# Patient Record
Sex: Female | Born: 1990 | Race: Black or African American | Hispanic: No | Marital: Single | Smoking: Current every day smoker
Health system: Southern US, Community
[De-identification: ages and names within clinical notes are randomized; demographics above are authoritative.]

## PROBLEM LIST (undated history)

## (undated) DIAGNOSIS — D649 Anemia, unspecified: Secondary | ICD-10-CM

---

## 2017-09-11 ENCOUNTER — Emergency Department
Admission: EM | Admit: 2017-09-11 | Discharge: 2017-09-12 | Disposition: A | Discharge status: Other Acute Inpatient Hospital | Payer: Medicare Other | Attending: Emergency Medicine | Admitting: Emergency Medicine

## 2017-09-11 DIAGNOSIS — R45851 Suicidal ideations: Secondary | ICD-10-CM | POA: Insufficient documentation

## 2017-09-11 DIAGNOSIS — Z91199 Patient's noncompliance with other medical treatment and regimen due to unspecified reason: Secondary | ICD-10-CM

## 2017-09-11 DIAGNOSIS — F1420 Cocaine dependence, uncomplicated: Secondary | ICD-10-CM

## 2017-09-11 DIAGNOSIS — F329 Major depressive disorder, single episode, unspecified: Secondary | ICD-10-CM

## 2017-09-11 DIAGNOSIS — Z9119 Patient's noncompliance with other medical treatment and regimen: Secondary | ICD-10-CM

## 2017-09-11 DIAGNOSIS — F32A Depression, unspecified: Secondary | ICD-10-CM

## 2017-09-11 DIAGNOSIS — F209 Schizophrenia, unspecified: Secondary | ICD-10-CM | POA: Insufficient documentation

## 2017-09-11 DIAGNOSIS — F29 Unspecified psychosis not due to a substance or known physiological condition: Secondary | ICD-10-CM

## 2017-09-11 HISTORY — DX: Anemia, unspecified: D64.9

## 2017-09-11 LAB — CBC
HCT: 40.2 % (ref 35.0–47.0)
Hemoglobin: 13 g/dL (ref 12.0–16.0)
MCH: 26.3 pg (ref 26.0–34.0)
MCHC: 32.3 g/dL (ref 32.0–36.0)
MCV: 81.4 fL (ref 80.0–100.0)
PLATELETS: 444 10*3/uL — AB (ref 150–440)
RBC: 4.94 MIL/uL (ref 3.80–5.20)
RDW: 15.2 % — ABNORMAL HIGH (ref 11.5–14.5)
WBC: 8.6 10*3/uL (ref 3.6–11.0)

## 2017-09-11 LAB — COMPREHENSIVE METABOLIC PANEL
ALK PHOS: 76 U/L (ref 38–126)
ALT: 25 U/L (ref 14–54)
AST: 21 U/L (ref 15–41)
Albumin: 4 g/dL (ref 3.5–5.0)
Anion gap: 11 (ref 5–15)
BILIRUBIN TOTAL: 0.5 mg/dL (ref 0.3–1.2)
BUN: 6 mg/dL (ref 6–20)
CALCIUM: 9.3 mg/dL (ref 8.9–10.3)
CO2: 21 mmol/L — ABNORMAL LOW (ref 22–32)
CREATININE: 0.98 mg/dL (ref 0.44–1.00)
Chloride: 108 mmol/L (ref 101–111)
GFR calc non Af Amer: >60 mL/min (ref >60)
Glucose, Bld: 95 mg/dL (ref 65–99)
Potassium: 3.6 mmol/L (ref 3.5–5.1)
Sodium: 140 mmol/L (ref 135–145)
Total Protein: 7.4 g/dL (ref 6.5–8.1)

## 2017-09-11 LAB — URINE DRUG SCREEN, QUALITATIVE (ARMC ONLY)
Amphetamines, Ur Screen: NOT DETECTED
BARBITURATES, UR SCREEN: NOT DETECTED
Benzodiazepine, Ur Scrn: NOT DETECTED
CANNABINOID 50 NG, UR ~~LOC~~: NOT DETECTED
COCAINE METABOLITE, UR ~~LOC~~: POSITIVE — AB
MDMA (ECSTASY) UR SCREEN: NOT DETECTED
Methadone Scn, Ur: NOT DETECTED
Opiate, Ur Screen: NOT DETECTED
PHENCYCLIDINE (PCP) UR S: NOT DETECTED
TRICYCLIC, UR SCREEN: NOT DETECTED

## 2017-09-11 LAB — SALICYLATE LEVEL

## 2017-09-11 LAB — ETHANOL: Alcohol, Ethyl (B): <10 mg/dL (ref <10)

## 2017-09-11 LAB — ACETAMINOPHEN LEVEL: Acetaminophen (Tylenol), Serum: <10 ug/mL — ABNORMAL LOW (ref 10–30)

## 2017-09-11 LAB — POCT PREGNANCY, URINE: PREG TEST UR: NEGATIVE

## 2017-09-11 MED ORDER — LURASIDONE HCL 40 MG PO TABS
20.0000 mg | ORAL_TABLET | Freq: Every day | ORAL | Status: DC
  Administered 2017-09-12: 20 mg via ORAL
  Filled 2017-09-11: qty 1

## 2017-09-11 MED ORDER — OLANZAPINE 10 MG PO TABS
20.0000 mg | ORAL_TABLET | Freq: Every day | ORAL | Status: DC
  Administered 2017-09-11: 20 mg via ORAL
  Filled 2017-09-11: qty 2

## 2017-09-11 MED ORDER — LITHIUM CARBONATE ER 450 MG PO TBCR
750.0000 mg | EXTENDED_RELEASE_TABLET | Freq: Every day | ORAL | Status: DC
  Administered 2017-09-11: 750 mg via ORAL
  Filled 2017-09-11 (×2): qty 1

## 2017-09-11 NOTE — ED Notes (Signed)
Pt aware urine sample is needed, pt states she does not need to urinate at this time.

## 2017-09-11 NOTE — ED Notes (Signed)
Pt eating dinner at this time

## 2017-09-11 NOTE — ED Notes (Signed)
Pt's father notified pt had been moved rooms per pt request. Father made aware.
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
## 2017-09-11 NOTE — ED Notes (Signed)
[facility] Administrator: Justine Null - [phone]

## 2017-09-11 NOTE — ED Notes (Signed)
EKG given to EDP.  

## 2017-09-11 NOTE — ED Notes (Signed)
Report was received from Franki Cabot., RN; Pt. Verbalizes  complaints of Depression; Schizophrenia; having auditory/hallucinations; running away from the group home; distress of wanting to go to [place]; to live with her Dad(guardian); denies S.I./Hi. Continue to monitor with 15 min. Monitoring.

## 2017-09-11 NOTE — BHH Counselor (Signed)
Dr. Toni Amend accepted patient to [facility], however they are not accepting patients at this time; Patient can go into Unit on 09/12/17 AM

## 2017-09-11 NOTE — ED Provider Notes (Signed)
[facility] Emergency Department Provider Note  Time seen: 1:07 PM  I have reviewed the triage vital signs and the nursing notes.   HISTORY  Chief Complaint Suicidal    HPI Laura Briggs is a 26 y.o. female With a past medical history of anemia presents to the emergency department for possible suicidal ideation. According to report police officer found the patient after she ran away from a group home. At one point the patient stated she does not want to be here anymore, which was interpreted as a suicidal threat. Here the patient denies any SI or HI. She states she did not want to be at her group home anymore which is why she ran away. Denies any drug use or alcohol use. Denies any medical complaints.patient does appear somewhat depressed with a flat affect  Past Medical History:  Diagnosis Date  . Anemia     There are no active problems to display for this patient.   No past surgical history on file.  Prior to Admission medications   Not on File    No Known Allergies  No family history on file.  Social History Social History  Substance Use Topics  . Smoking status: Not on file  . Smokeless tobacco: Not on file  . Alcohol use Not on file    Review of Systems Constitutional: Negative for fever. Cardiovascular: Negative for chest pain. Respiratory: Negative for shortness of breath. Gastrointestinal: Negative for abdominal pain Musculoskeletal: Negative for back pain. Neurological: Negative for headache All other ROS negative  ____________________________________________   PHYSICAL EXAM:  VITAL SIGNS: ED Triage Vitals  Enc Vitals Group     BP 09/11/17 1126 130/79     Pulse Rate 09/11/17 1126 94     Resp 09/11/17 1126 16     Temp 09/11/17 1126 98.4 F (36.9 C)     Temp Source 09/11/17 1126 Oral     SpO2 09/11/17 1126 99 %     Weight 09/11/17 1127 210 lb (95.3 kg)     Height 09/11/17 1127  (1.626 m)     Head Circumference  --      Peak Flow --      Pain Score --      Pain Loc --      Pain Edu? --      Excl. in GC? --     Constitutional: Alert and oriented. Well appearing and in no distress. Eyes: Normal exam ENT   Head: Normocephalic and atraumatic   Mouth/Throat: Mucous membranes are moist. Cardiovascular: Normal rate, regular rhythm. No murmur Respiratory: Normal respiratory effort without tachypnea nor retractions. Breath sounds are clear Gastrointestinal: Soft and nontender. No distention.  Musculoskeletal: Nontender with normal range of motion in all extremities.  Neurologic:  Normal speech and language. No gross focal neurologic deficits Skin:  Skin is warm, dry and intact.  Psychiatric: flat affect, appears depressed.  ____________________________________________    INITIAL IMPRESSION / ASSESSMENT AND PLAN / ED COURSE  Pertinent labs & imaging results that were available during my care of the patient were reviewed by me and considered in my medical decision making (see chart for details).  patient presents to the emergency department after running away from a group home. Patient very clearly to me denies any SI or HI. However given the patient's statement to the officer which she interpreted as suicidal we will have psychiatry evaluate. Patient denies any drug or alcohol use. Patient's labs are largely within normal limits. Urinalysis  pending. Patient does not meet IVC criteria currently. differential at this time would include depression, suicidal ideation, underlying psychiatric condition.  specialist on call psychiatry evaluation pending. Patient care signed out to oncoming physician.  ____________________________________________   FINAL CLINICAL IMPRESSION(S) / ED DIAGNOSES  depression    Minna Antis, MD 09/11/17 1454

## 2017-09-11 NOTE — ED Notes (Signed)
Pt belonging placed in bag. Shirt, bra, pants, purse. No cell phone or money.

## 2017-09-11 NOTE — ED Provider Notes (Signed)
-----------------------------------------   5:17 PM on 09/11/2017 -----------------------------------------  ED ECG REPORT I, Dionne Bucy, the attending physician, personally viewed and interpreted this ECG.  Date: 09/11/2017 EKG Time: 1711 Rate: 64 Rhythm: sinus rhythm sinus arrhythmia QRS Axis: normal Intervals: normal ST/T Wave abnormalities: normal Narrative Interpretation: no evidence of acute ischemia    Dionne Bucy, MD 09/11/17 1718

## 2017-09-11 NOTE — ED Notes (Signed)
Father at beside.

## 2017-09-11 NOTE — ED Notes (Signed)
Copy of pt's Father's Letters of Appointment Guardian of the Person copy placed on pt's chart.

## 2017-09-11 NOTE — ED Notes (Signed)
Patient IVC and is pending admission.

## 2017-09-11 NOTE — ED Notes (Signed)
Patient states she is here because she got in argument with a staff member at her group home and left there to go to a friends house. She states she does not want to return there. Patient denies SI/HI. Patient states she has auditory hallucinations telling her  "she is no good." Patient denies that voices tell her to harm herself. Patient denies visual hallucinations. Patient with Q 15 minute checks in progress. Monitoring continues and patient remains safe on unit.

## 2017-09-11 NOTE — ED Notes (Signed)
SOC report given. SOC at bedside.

## 2017-09-11 NOTE — ED Triage Notes (Signed)
Pt came to ED, brought in by officer. From [facility]. Pt ran away 2 days ago, [place] and stated "I do not want to be here anymore." When asked by this Rn if pt had any thought of hurting herself pt responded by saying "I just had a mental breakdown." Denies psych history, denies on medication but police reports group home says she takes medication.

## 2017-09-12 ENCOUNTER — Inpatient Hospital Stay
Admission: AD | Admit: 2017-09-12 | Discharge: 2017-09-24 | DRG: 885 | Disposition: A | Discharge status: Home / Self Care | Payer: Medicare Other | Attending: Psychiatry | Admitting: Psychiatry

## 2017-09-12 DIAGNOSIS — Z91199 Patient's noncompliance with other medical treatment and regimen due to unspecified reason: Secondary | ICD-10-CM

## 2017-09-12 DIAGNOSIS — F200 Paranoid schizophrenia: Principal | ICD-10-CM | POA: Diagnosis present

## 2017-09-12 DIAGNOSIS — Z23 Encounter for immunization: Secondary | ICD-10-CM

## 2017-09-12 DIAGNOSIS — F1420 Cocaine dependence, uncomplicated: Secondary | ICD-10-CM | POA: Diagnosis present

## 2017-09-12 DIAGNOSIS — F17200 Nicotine dependence, unspecified, uncomplicated: Secondary | ICD-10-CM | POA: Diagnosis present

## 2017-09-12 DIAGNOSIS — X58XXXA Exposure to other specified factors, initial encounter: Secondary | ICD-10-CM | POA: Diagnosis present

## 2017-09-12 DIAGNOSIS — A150 Tuberculosis of lung: Secondary | ICD-10-CM

## 2017-09-12 DIAGNOSIS — F17210 Nicotine dependence, cigarettes, uncomplicated: Secondary | ICD-10-CM | POA: Diagnosis present

## 2017-09-12 DIAGNOSIS — Z91128 Patient's intentional underdosing of medication regimen for other reason: Secondary | ICD-10-CM

## 2017-09-12 DIAGNOSIS — T43596A Underdosing of other antipsychotics and neuroleptics, initial encounter: Secondary | ICD-10-CM | POA: Diagnosis present

## 2017-09-12 DIAGNOSIS — Z9119 Patient's noncompliance with other medical treatment and regimen: Secondary | ICD-10-CM

## 2017-09-12 DIAGNOSIS — F410 Panic disorder [episodic paroxysmal anxiety] without agoraphobia: Secondary | ICD-10-CM | POA: Diagnosis present

## 2017-09-12 DIAGNOSIS — F209 Schizophrenia, unspecified: Secondary | ICD-10-CM

## 2017-09-12 DIAGNOSIS — G4700 Insomnia, unspecified: Secondary | ICD-10-CM | POA: Diagnosis present

## 2017-09-12 MED ORDER — DIPHENHYDRAMINE HCL 25 MG PO CAPS: 50.0000 mg | ORAL_CAPSULE | Freq: Four times a day (QID) | ORAL | Status: DC | PRN

## 2017-09-12 MED ORDER — BENZTROPINE MESYLATE 1 MG PO TABS
0.5000 mg | ORAL_TABLET | Freq: Two times a day (BID) | ORAL | Status: DC
  Administered 2017-09-12: 0.5 mg via ORAL
  Filled 2017-09-12: qty 1

## 2017-09-12 MED ORDER — ACETAMINOPHEN 325 MG PO TABS: 650.0000 mg | ORAL_TABLET | Freq: Four times a day (QID) | ORAL | Status: DC | PRN

## 2017-09-12 MED ORDER — OLANZAPINE 10 MG PO TABS
20.0000 mg | ORAL_TABLET | Freq: Every day | ORAL | Status: DC
  Administered 2017-09-12: 20 mg via ORAL
  Filled 2017-09-12: qty 2

## 2017-09-12 MED ORDER — LITHIUM CARBONATE ER 300 MG PO TBCR
750.0000 mg | EXTENDED_RELEASE_TABLET | Freq: Every day | ORAL | Status: AC
  Administered 2017-09-12: 22:00:00 750 mg via ORAL
  Filled 2017-09-12 (×2): qty 1

## 2017-09-12 MED ORDER — LITHIUM CARBONATE ER 450 MG PO TBCR: 750.0000 mg | EXTENDED_RELEASE_TABLET | Freq: Every day | ORAL | Status: DC

## 2017-09-12 MED ORDER — TEMAZEPAM 15 MG PO CAPS
15.0000 mg | ORAL_CAPSULE | Freq: Every day | ORAL | Status: DC
  Administered 2017-09-12 – 2017-09-16 (×5): 15 mg via ORAL
  Filled 2017-09-12 (×5): qty 1

## 2017-09-12 MED ORDER — LURASIDONE HCL 20 MG PO TABS: 20.0000 mg | ORAL_TABLET | Freq: Every day | ORAL | Status: DC

## 2017-09-12 MED ORDER — ALUM & MAG HYDROXIDE-SIMETH 200-200-20 MG/5ML PO SUSP: 30.0000 mL | ORAL | Status: DC | PRN

## 2017-09-12 MED ORDER — TRAZODONE HCL 100 MG PO TABS: 100.0000 mg | ORAL_TABLET | Freq: Every evening | ORAL | Status: DC | PRN

## 2017-09-12 MED ORDER — HALOPERIDOL 2 MG PO TABS
4.0000 mg | ORAL_TABLET | Freq: Two times a day (BID) | ORAL | Status: DC
  Administered 2017-09-12: 4 mg via ORAL
  Filled 2017-09-12: qty 2

## 2017-09-12 MED ORDER — HALOPERIDOL 5 MG PO TABS
5.0000 mg | ORAL_TABLET | Freq: Two times a day (BID) | ORAL | Status: DC
  Administered 2017-09-13: 5 mg via ORAL
  Filled 2017-09-12: qty 1

## 2017-09-12 MED ORDER — NICOTINE 21 MG/24HR TD PT24
21.0000 mg | MEDICATED_PATCH | Freq: Every day | TRANSDERMAL | Status: DC
  Filled 2017-09-12 (×7): qty 1

## 2017-09-12 MED ORDER — LITHIUM CARBONATE ER 300 MG PO TBCR
300.0000 mg | EXTENDED_RELEASE_TABLET | Freq: Three times a day (TID) | ORAL | Status: DC
  Administered 2017-09-13 – 2017-09-14 (×5): 300 mg via ORAL
  Filled 2017-09-12 (×5): qty 1

## 2017-09-12 MED ORDER — MAGNESIUM HYDROXIDE 400 MG/5ML PO SUSP: 30.0000 mL | Freq: Every day | ORAL | Status: DC | PRN

## 2017-09-12 NOTE — Consult Note (Signed)
[facility] Consult   Reason for Consult:  Consult for 26 year old woman who was brought to the hospital because of eloping from her group home and having worsening psychosis Referring Physician:  Karma Greaser Patient Identification: Laura Briggs MRN:  [id number] Principal Diagnosis: Schizophrenia [facility]) Diagnosis:   Patient Active Problem List   Diagnosis Date Noted  . Schizophrenia ([place]) [F20.9] 09/12/2017  . Cocaine abuse ([place]) [F14.10] 09/12/2017  . Noncompliance [Z91.19] 09/12/2017    Total Time spent with patient: 1 hour  Subjective:   Laura Briggs is a 26 y.o. female patient admitted with "the voices told me to".  HPI:  Patient interviewed chart reviewed. 26 year old woman who resides at a local group home. She says that she left the group home on Friday because she thinks they don't treat her well. She went to stay with a friend of hers over the weekend. During that time she did not take her antipsychotic medicine. She admits to me that she had used "Molly" but her drug screen is positive also for cocaine. She said that during that time she didn't sleep for several days and that the voices have come back and have been very disturbing and loud. She denies having any intention to kill her self or thought of harming anyone else but she was getting frightened and upset so she telephoned her father. Father arranged for her to be picked up and brought here to the hospital. Patient is currently denying any thought of hurting herself or hurting anyone else. Says the voices have calm down and are not noticeable today. She still thinks that she needs to be in the hospital her mood is feeling depressed and down and withdrawn.  Social history: Patient lives in a local group home says that she has been there for about 8 months. She claims to dislike it and says that she thinks the people there are mean to her. Her example is that they took her cell phone away from her because she  was making too many calls late at night. She says she has a good relationship with her family appears to be closest with her father.  Medical history: Denies any significant medical problems outside of the mental health  Substance abuse history: Says she does not drink alcohol frequently but will occasionally have some on weekends. Doesn't think it's ever been a problem. Denies that she uses other drugs other than her statement that she used "Cloyde Reams" this week although her drug screen is positive for cocaine.  Past Psychiatric History: Patient reports that she started having mental health problems about age 76. Has had several prior hospitalizations. Denies ever having tried to kill her self or been violent with others. Frequently does have hallucinations. She remembers her current medicines as being Zyprexa and Latuda but doesn't remember prior medicines. She currently sees Dr. Kasandra Knudsen in [facility] for outpatient care. Has not been admitted to our hospital before. Says her last hospitalization was about 6 months ago.  Risk to Self: Suicidal Ideation: No Suicidal Intent: No-Not Currently/Within Last 6 Months Is patient at risk for suicide?: No Suicidal Plan?: No Access to Means: Yes (household items) Specify Access to Suicidal Means: household items What has been your use of drugs/alcohol within the last 12 months?: cocaine and alcohol  How many times?: 0 Other Self Harm Risks: 0 Triggers for Past Attempts: None known Intentional Self Injurious Behavior: Damaging (drug and alcohol abuse) Comment - Self Injurious Behavior: drug and alcohol abuse Risk to Others: Homicidal Ideation:  No Thoughts of Harm to Others: No Current Homicidal Intent: No Current Homicidal Plan: No Access to Homicidal Means: No (household items) Identified Victim: none Assessment of Violence: None Noted Violent Behavior Description: cooperative Does patient have access to weapons?: No Does patient have a court date:  No Prior Inpatient Therapy: Prior Inpatient Therapy: Yes Prior Therapy Dates: 2017 Prior Therapy Facilty/Provider(s): [facility]  Reason for Treatment: depression Prior Outpatient Therapy: Prior Outpatient Therapy: Yes Prior Therapy Dates: 2018 Prior Therapy Facilty/Provider(s): [facility]  Reason for Treatment: depression Does patient have an ACCT team?: Yes ([facility] ) Does patient have Intensive In-House Services?  : No Does patient have [facility] services? : No Does patient have P4CC services?: No  Past Medical History:  Past Medical History:  Diagnosis Date  . Anemia    No past surgical history on file. Family History: No family history on file. Family Psychiatric  History: She is not aware of any family mental health or substance abuse history Social History:  History  Alcohol use Not on file     History  Drug use: Unknown    Social History   Social History  . Marital status: Single    Spouse name: N/A  . Number of children: N/A  . Years of education: N/A   Social History Main Topics  . Smoking status: None  . Smokeless tobacco: None  . Alcohol use None  . Drug use: Unknown  . Sexual activity: Not Asked   Other Topics Concern  . None   Social History Narrative  . None   Additional Social History:    Allergies:  No Known Allergies  Labs:  Results for orders placed or performed during the hospital encounter of 09/11/17 (from the past 48 hour(s))  Comprehensive metabolic panel     Status: Abnormal   Collection Time: 09/11/17 11:31 AM  Result Value Ref Range   Sodium 140 135 - 145 mmol/L   Potassium 3.6 3.5 - 5.1 mmol/L   Chloride 108 101 - 111 mmol/L   CO2 21 (L) 22 - 32 mmol/L   Glucose, Bld 95 65 - 99 mg/dL   BUN 6 6 - 20 mg/dL   Creatinine, Ser 0.98 0.44 - 1.00 mg/dL   Calcium 9.3 8.9 - 10.3 mg/dL   Total Protein 7.4 6.5 - 8.1 g/dL   Albumin 4.0 3.5 - 5.0 g/dL   AST 21 15 - 41 U/L   ALT 25 14 - 54 U/L   Alkaline Phosphatase 76 38 - 126  U/L   Total Bilirubin 0.5 0.3 - 1.2 mg/dL   GFR calc non Af Amer >60 >60 mL/min   GFR calc Af Amer >60 >60 mL/min    Comment: (NOTE) The eGFR has been calculated using the CKD EPI equation. This calculation has not been validated in all clinical situations. eGFR's persistently <60 mL/min signify possible Chronic Kidney Disease.    Anion gap 11 5 - 15  Ethanol     Status: None   Collection Time: 09/11/17 11:31 AM  Result Value Ref Range   Alcohol, Ethyl (B) <10 <10 mg/dL    Comment:        LOWEST DETECTABLE LIMIT FOR SERUM ALCOHOL IS 10 mg/dL FOR MEDICAL PURPOSES ONLY Please note change in reference range.   Salicylate level     Status: None   Collection Time: 09/11/17 11:31 AM  Result Value Ref Range   Salicylate Lvl <2.2 2.8 - 30.0 mg/dL  Acetaminophen level  Status: Abnormal   Collection Time: 09/11/17 11:31 AM  Result Value Ref Range   Acetaminophen (Tylenol), Serum <10 (L) 10 - 30 ug/mL    Comment:        THERAPEUTIC CONCENTRATIONS VARY SIGNIFICANTLY. A RANGE OF 10-30 ug/mL MAY BE AN EFFECTIVE CONCENTRATION FOR MANY PATIENTS. HOWEVER, SOME ARE BEST TREATED AT CONCENTRATIONS OUTSIDE THIS RANGE. ACETAMINOPHEN CONCENTRATIONS >150 ug/mL AT 4 HOURS AFTER INGESTION AND >50 ug/mL AT 12 HOURS AFTER INGESTION ARE OFTEN ASSOCIATED WITH TOXIC REACTIONS.   cbc     Status: Abnormal   Collection Time: 09/11/17 11:31 AM  Result Value Ref Range   WBC 8.6 3.6 - 11.0 K/uL   RBC 4.94 3.80 - 5.20 MIL/uL   Hemoglobin 13.0 12.0 - 16.0 g/dL   HCT 40.2 35.0 - 47.0 %   MCV 81.4 80.0 - 100.0 fL   MCH 26.3 26.0 - 34.0 pg   MCHC 32.3 32.0 - 36.0 g/dL   RDW 15.2 (H) 11.5 - 14.5 %   Platelets 444 (H) 150 - 440 K/uL  Urine Drug Screen, Qualitative     Status: Abnormal   Collection Time: 09/11/17  5:08 PM  Result Value Ref Range   Tricyclic, Ur Screen NONE DETECTED NONE DETECTED   Amphetamines, Ur Screen NONE DETECTED NONE DETECTED   MDMA (Ecstasy)Ur Screen NONE DETECTED NONE  DETECTED   Cocaine Metabolite,Ur [place] POSITIVE (A) NONE DETECTED   Opiate, Ur Screen NONE DETECTED NONE DETECTED   Phencyclidine (PCP) Ur S NONE DETECTED NONE DETECTED   Cannabinoid 50 Ng, Ur [place] NONE DETECTED NONE DETECTED   Barbiturates, Ur Screen NONE DETECTED NONE DETECTED   Benzodiazepine, Ur Scrn NONE DETECTED NONE DETECTED   Methadone Scn, Ur NONE DETECTED NONE DETECTED    Comment: (NOTE) 297  Tricyclics, urine               Cutoff 1000 ng/mL 200  Amphetamines, urine             Cutoff 1000 ng/mL 300  MDMA (Ecstasy), urine           Cutoff 500 ng/mL 400  Cocaine Metabolite, urine       Cutoff 300 ng/mL 500  Opiate, urine                   Cutoff 300 ng/mL 600  Phencyclidine (PCP), urine      Cutoff 25 ng/mL 700  Cannabinoid, urine              Cutoff 50 ng/mL 800  Barbiturates, urine             Cutoff 200 ng/mL 900  Benzodiazepine, urine           Cutoff 200 ng/mL 1000 Methadone, urine                Cutoff 300 ng/mL 1100 1200 The urine drug screen provides only a preliminary, unconfirmed 1300 analytical test result and should not be used for non-medical 1400 purposes. Clinical consideration and professional judgment should 1500 be applied to any positive drug screen result due to possible 1600 interfering substances. A more specific alternate chemical method 1700 must be used in order to obtain a confirmed analytical result.  1800 Gas chromato graphy / mass spectrometry (GC/MS) is the preferred 1900 confirmatory method.   Pregnancy, urine POC     Status: None   Collection Time: 09/11/17  5:26 PM  Result Value Ref Range   Preg Test, Ur NEGATIVE NEGATIVE  Comment:        THE SENSITIVITY OF THIS METHODOLOGY IS >24 mIU/mL     Current Facility-Administered Medications  Medication Dose Route Frequency Provider Last Rate Last Dose  . lithium carbonate (LITHOBID) CR tablet 750 mg  750 mg Oral QHS Arta Silence, MD   750 mg at 09/11/17 2134  . lurasidone (LATUDA)  tablet 20 mg  20 mg Oral Q breakfast Arta Silence, MD   20 mg at 09/12/17 9323  . OLANZapine (ZYPREXA) tablet 20 mg  20 mg Oral QHS Arta Silence, MD   20 mg at 09/11/17 2134   No current outpatient prescriptions on file.    Musculoskeletal: Strength & Muscle Tone: within normal limits Gait & Station: normal Patient leans: N/A  Psychiatric Specialty Exam: Physical Exam  Nursing note and vitals reviewed. Constitutional: She appears well-developed and well-nourished.  HENT:  Head: Normocephalic and atraumatic.  Eyes: Pupils are equal, round, and reactive to light. Conjunctivae are normal.  Neck: Normal range of motion.  Cardiovascular: Regular rhythm and normal heart sounds.   Respiratory: Effort normal. No respiratory distress.  GI: Soft.  Musculoskeletal: Normal range of motion.  Neurological: She is alert.  Skin: Skin is warm and dry.  Psychiatric: Her affect is blunt. Her speech is delayed. She is slowed and withdrawn. Thought content is paranoid. Cognition and memory are impaired. She expresses impulsivity. She expresses no homicidal and no suicidal ideation. She exhibits abnormal recent memory.    Review of Systems  Constitutional: Negative.   HENT: Negative.   Eyes: Negative.   Respiratory: Negative.   Cardiovascular: Negative.   Gastrointestinal: Negative.   Musculoskeletal: Negative.   Skin: Negative.   Neurological: Negative.   Psychiatric/Behavioral: Positive for depression, hallucinations, memory loss and substance abuse. Negative for suicidal ideas. The patient is nervous/anxious and has insomnia.     Blood pressure 123/77, pulse 77, temperature 98 F (36.7 C), temperature source Oral, resp. rate 18, height '5\' 4"'$  (1.626 m), weight 95.3 kg (210 lb), SpO2 100 %.Body mass index is 36.05 kg/m.  General Appearance: Casual  Eye Contact:  None  Speech:  Slow  Volume:  Decreased  Mood:  Dysphoric  Affect:  Constricted  Thought Process:  Coherent   Orientation:  Full (Time, Place, and Person)  Thought Content:  Very concrete. Answers questions minimally.  Suicidal Thoughts:  No  Homicidal Thoughts:  No  Memory:  Immediate;   Fair Recent;   Poor Remote;   Fair  Judgement:  Impaired  Insight:  Fair  Psychomotor Activity:  Decreased  Concentration:  Concentration: Fair  Recall:  [employer] of Knowledge:  Fair  Language:  Fair  Akathisia:  No  Handed:  Right  AIMS (if indicated):     Assets:  Desire for Improvement Housing Physical Health Social Support  ADL's:  Impaired  Cognition:  Impaired,  Mild  Sleep:        Treatment Plan Summary: Daily contact with patient to assess and evaluate symptoms and progress in treatment, Medication management and Plan 26 year old woman who appears to probably have schizophrenia who comes into the hospital having run away from her group home. Sounds like behavior problems and hallucinations have been escalating. Patient says that she agrees with me that being in the hospital would be appropriate. Current psychosis and behavior problems make her dangerousness to send back home immediately. I will put in orders for admission to the inpatient psychiatric unit and try to continue medications as previously prescribed. EKG  ordered. Full set of labs will be obtained.  Disposition: Recommend psychiatric Inpatient admission when medically cleared. Supportive therapy provided about ongoing stressors.  Alethia Berthold, MD 09/12/2017 12:51 PM

## 2017-09-12 NOTE — ED Provider Notes (Signed)
-----------------------------------------   6:56 AM on 09/12/2017 -----------------------------------------   Blood pressure 119/79, pulse 96, temperature 98.5 F (36.9 C), temperature source Oral, resp. rate 16, height 1.626 m ( ), weight 95.3 kg (210 lb), SpO2 100 %.  The patient had no acute events since last update.  Calm and cooperative at this time.  SOC recommended inpatient treatment.     Loleta Rose, MD 09/12/17 [phone]

## 2017-09-12 NOTE — ED Notes (Signed)
Patient alert and oriented. Patient is eating breakfast. Patient states she continues to hear voices telling her she is no good. Patient denies SI/HI. Patient denies A/V/H. Patient is aware she will be admitted to BMU today. Patient voices no concerns at this time. Q 15 minute checks in progress and patient remains safe on unit. Monitoring of patient continues.

## 2017-09-12 NOTE — Tx Team (Signed)
Initial Treatment Plan 09/12/2017 4:34 PM Laura Briggs AVW:[id number]    PATIENT STRESSORS: Medication change or noncompliance   PATIENT STRENGTHS: Ability for insight   PATIENT IDENTIFIED PROBLEMS:                      DISCHARGE CRITERIA:  Ability to meet basic life and health needs Adequate post-discharge living arrangements  PRELIMINARY DISCHARGE PLAN: Attend aftercare/continuing care group Participate in family therapy  PATIENT/FAMILY INVOLVEMENT: This treatment plan has been presented to and reviewed with the patient, Laura Briggs, and/or family member.  The patient and family have been given the opportunity to ask questions and make suggestions.  Marjo Bicker, RN 09/12/2017, 4:34 PM

## 2017-09-12 NOTE — ED Notes (Addendum)
Patient discharge from [facility] to admit to BMU room 316. Patient alert and oriented. Patient aware of admission and voices no concerns. Patient vitals 98.6-122/77-89-20-100% room air. Patient belongings taken with her to BMU unit. Patient transported via wheelchair with company police and this Clinical research associate to unit. Report called to [facility] on unit.

## 2017-09-12 NOTE — Progress Notes (Signed)
Patient is admitted from the BHU she is pleasant and cooperative with the search and interview. She however, was guarded and thought blocking. She made poor eye contact. She is soft spoken and only answers simple questions. She states she left her group home cause they were mean to her. She states they wouldn't  Let her use her phone and they were talking about her. She states the other girls were told not to let her use their phone either. So, she decided to leave and get drunk. She states she only had 2 beers and took a molly. She also stated she quit taking her medications so she could drink. She she states she only drinks about once a month.  She states she wants to go home with her father who lives in [place] at discharge. She states she was in a facility in [place] about 2 years ago.  She denies SI, HI, but hears voices that tell her she is no good and she will never amount to anything.  Skin assessment is unremarkable 1 tattoo on her right shoulder. Belly piercing and left eye piercing. Patient is shown the unit and given time to ask questions.

## 2017-09-13 DIAGNOSIS — F200 Paranoid schizophrenia: Principal | ICD-10-CM

## 2017-09-13 LAB — HEMOGLOBIN A1C
Hgb A1c MFr Bld: 5.1 % (ref 4.8–5.6)
Mean Plasma Glucose: 99.67 mg/dL

## 2017-09-13 LAB — LIPID PANEL
CHOLESTEROL: 205 mg/dL — AB (ref 0–200)
HDL: 30 mg/dL — ABNORMAL LOW (ref >40)
LDL CALC: 135 mg/dL — AB (ref 0–99)
TRIGLYCERIDES: 198 mg/dL — AB (ref <150)
Total CHOL/HDL Ratio: 6.8 RATIO
VLDL: 40 mg/dL (ref 0–40)

## 2017-09-13 LAB — TSH: TSH: 0.656 u[IU]/mL (ref 0.350–4.500)

## 2017-09-13 MED ORDER — OLANZAPINE 10 MG PO TABS
30.0000 mg | ORAL_TABLET | Freq: Every day | ORAL | Status: DC
  Administered 2017-09-13 – 2017-09-23 (×11): 30 mg via ORAL
  Filled 2017-09-13 (×11): qty 3

## 2017-09-13 MED ORDER — FLUPHENAZINE HCL 5 MG PO TABS
5.0000 mg | ORAL_TABLET | Freq: Three times a day (TID) | ORAL | Status: DC
  Administered 2017-09-13 – 2017-09-14 (×4): 5 mg via ORAL
  Filled 2017-09-13 (×4): qty 1

## 2017-09-13 NOTE — H&P (Addendum)
Psychiatric Admission Assessment Adult  Patient Identification: Laura Briggs MRN:  [id number] Date of Evaluation:  09/13/2017 Chief Complaint:  psychosis Principal Diagnosis: Schizophrenia, paranoid (HCC) Diagnosis:   Patient Active Problem List   Diagnosis Date Noted  . Schizophrenia (HCC) [F20.9] 09/12/2017  . Cocaine use disorder, moderate, dependence (HCC) [F14.20] 09/12/2017  . Noncompliance [Z91.19] 09/12/2017  . Schizophrenia, paranoid (HCC) [F20.0] 09/12/2017  . Tobacco use disorder [F17.200] 09/12/2017   History of Present Illness:   Identifying data. Laura Briggs is a 26 year old female with history of schizophrenia.  Chief complaint. "Do you think it is my voice."  History of present illness. Information was obtained from the patient and the chart. The patient came to the emergency room after she eloped from her group home for 2 days, staying with her friend, not taking medications, taking "Molly". She became acutely psychotic with loud demeaning auditory hallucinations and paranoia. She called her father and was brought to the emergency room. The patient has a long history of schizophrenia. She has been maintained on a combination of lithium, Abilify, Zyprexa, and Latuda in the care of Dr. Janeece Riggers. She has been working with Dr. Janeece Riggers for the past 6 months but her auditory hallucinations have never gone away. She has been dating her current group home for 6 months as well. She believes that they have always mistreated her and the situation has gotten worse recently forcing her to run away. She denies depressive symptoms or symptoms suggestive of bipolar mania. She has anxiety attacks sometimes twice a day depending on the strength and content of her voices. The voices never go away. At best she hears noises that she compares to football game crowd. She denies PTSD or OCD symptoms. While staying with a friend for 2 days she says she used marijuana and Molly.   Past psychiatric history.  She was diagnosed with mental illness which she was in college at the age of 40. She has 3 prior hospitalization. She has been tried on multiple medications with no success. She has tremor from Haldol and liver problems. Depakote. Her current regimen in LITHIUM and 3 second generation antipsychotics. She denies ever attempting suicide.  Family psychiatric history. Nonreported.  Social history. She is an incompetent adult. Her father is the guardian. She has been at her current group home for 6 months but does not want to return there. Reportedly her father has already identified another facility. She is disabled from mental illness.  Total Time spent with patient: 1 hour  Is the patient at risk to self? No.  Has the patient been a risk to self in the past 6 months? No.  Has the patient been a risk to self within the distant past? No.  Is the patient a risk to others? No.  Has the patient been a risk to others in the past 6 months? No.  Has the patient been a risk to others within the distant past? No.   Prior Inpatient Therapy:   Prior Outpatient Therapy:    Alcohol Screening: 1. How often do you have a drink containing alcohol?: Monthly or less 2. How many drinks containing alcohol do you have on a typical day when you are drinking?: 3 or 4 3. How often do you have six or more drinks on one occasion?: Never Preliminary Score: 1 4. How often during the last year have you found that you were not able to stop drinking once you had started?: Never 5. How often during the last year  have you failed to do what was normally expected from you becasue of drinking?: Never 6. How often during the last year have you needed a first drink in the morning to get yourself going after a heavy drinking session?: Never 7. How often during the last year have you had a feeling of guilt of remorse after drinking?: Never 8. How often during the last year have you been unable to remember what happened the night  before because you had been drinking?: Never 9. Have you or someone else been injured as a result of your drinking?: No 10. Has a relative or friend or a doctor or another health worker been concerned about your drinking or suggested you cut down?: No Alcohol Use Disorder Identification Test Final Score (AUDIT): 2 Substance Abuse History in the last 12 months:  Yes.   Consequences of Substance Abuse: Negative Previous Psychotropic Medications: Yes  Psychological Evaluations: No  Past Medical History:  Past Medical History:  Diagnosis Date  . Anemia    History reviewed. No pertinent surgical history. Family History: History reviewed. No pertinent family history.  Tobacco Screening:   Social History:  History  Alcohol Use  . Yes     History  Drug Use  . Types: Other-see comments    Comment: molly per patient    Additional Social History:                           Allergies:  No Known Allergies Lab Results:  Results for orders placed or performed during the hospital encounter of 09/12/17 (from the past 48 hour(s))  Lipid panel     Status: Abnormal   Collection Time: 09/13/17 10:08 AM  Result Value Ref Range   Cholesterol 205 (H) 0 - 200 mg/dL   Triglycerides 161 (H) <150 mg/dL   HDL 30 (L) >09 mg/dL   Total CHOL/HDL Ratio 6.8 RATIO   VLDL 40 0 - 40 mg/dL   LDL Cholesterol 604 (H) 0 - 99 mg/dL    Comment:        Total Cholesterol/HDL:CHD Risk Coronary Heart Disease Risk Table                     Men   Women  1/2 Average Risk   3.4   3.3  Average Risk       5.0   4.4  2 X Average Risk   9.6   7.1  3 X Average Risk  23.4   11.0        Use the calculated Patient Ratio above and the CHD Risk Table to determine the patient's CHD Risk.        ATP III CLASSIFICATION (LDL):  <100     mg/dL   Optimal  540-981  mg/dL   Near or Above                    Optimal  130-159  mg/dL   Borderline  191-478  mg/dL   High  >295     mg/dL   Very High   TSH     Status:  None   Collection Time: 09/13/17 10:08 AM  Result Value Ref Range   TSH 0.656 0.350 - 4.500 uIU/mL    Comment: Performed by a 3rd Generation assay with a functional sensitivity of <=0.01 uIU/mL.    Blood Alcohol level:  Lab Results  Component Value Date   ETH <10  09/11/2017    Metabolic Disorder Labs:  No results found for: HGBA1C, MPG No results found for: PROLACTIN Lab Results  Component Value Date   CHOL 205 (H) 09/13/2017   TRIG 198 (H) 09/13/2017   HDL 30 (L) 09/13/2017   CHOLHDL 6.8 09/13/2017   VLDL 40 09/13/2017   LDLCALC 135 (H) 09/13/2017    Current Medications: Current Facility-Administered Medications  Medication Dose Route Frequency Provider Last Rate Last Dose  . acetaminophen (TYLENOL) tablet 650 mg  650 mg Oral Q6H PRN Clapacs, John T, MD      . alum & mag hydroxide-simeth (MAALOX/MYLANTA) 200-200-20 MG/5ML suspension 30 mL  30 mL Oral Q4H PRN Clapacs, John T, MD      . diphenhydrAMINE (BENADRYL) capsule 50 mg  50 mg Oral Q6H PRN Clapacs, John T, MD      . fluPHENAZine (PROLIXIN) tablet 5 mg  5 mg Oral Q8H Kadelyn Dimascio B, MD      . lithium carbonate (LITHOBID) CR tablet 300 mg  300 mg Oral TID AC Maddeline Roorda B, MD   300 mg at 09/13/17 0844  . magnesium hydroxide (MILK OF MAGNESIA) suspension 30 mL  30 mL Oral Daily PRN Clapacs, John T, MD      . nicotine (NICODERM CQ - dosed in mg/24 hours) patch 21 mg  21 mg Transdermal Daily Jenna Routzahn B, MD      . OLANZapine (ZYPREXA) tablet 30 mg  30 mg Oral QHS Mallika Sanmiguel B, MD      . temazepam (RESTORIL) capsule 15 mg  15 mg Oral QHS Asha Grumbine B, MD   15 mg at 09/12/17 2226   PTA Medications: No prescriptions prior to admission.    Musculoskeletal: Strength & Muscle Tone: within normal limits Gait & Station: normal Patient leans: N/A  Psychiatric Specialty Exam: Physical Exam  Nursing note and vitals reviewed. Constitutional: She is oriented to person, place, and time.  She appears well-developed and well-nourished.  HENT:  Head: Normocephalic and atraumatic.  Eyes: Pupils are equal, round, and reactive to light. Conjunctivae and EOM are normal.  Neck: Normal range of motion. Neck supple.  Cardiovascular: Normal rate, regular rhythm and normal heart sounds.   Respiratory: Effort normal and breath sounds normal.  GI: Soft. Bowel sounds are normal.  Musculoskeletal: Normal range of motion.  Neurological: She is alert and oriented to person, place, and time.  Skin: Skin is warm and dry.  Psychiatric: Her speech is normal. Her mood appears anxious. Her affect is blunt. She is withdrawn and actively hallucinating. Thought content is paranoid and delusional. Cognition and memory are normal. She expresses impulsivity.    Review of Systems  Constitutional: Negative.   HENT: Negative.   Eyes: Negative.   Respiratory: Negative.   Cardiovascular: Negative.   Gastrointestinal: Negative.   Genitourinary: Negative.   Musculoskeletal: Negative.   Skin: Negative.   Neurological: Negative.   Endo/Heme/Allergies: Negative.   Psychiatric/Behavioral: Positive for hallucinations and substance abuse. The patient is nervous/anxious and has insomnia.     Blood pressure 97/75, pulse 88, temperature 97.8 F (36.6 C), temperature source Oral, resp. rate 16, height  (1.651 m), weight 95.3 kg (210 lb), SpO2 100 %.Body mass index is 34.95 kg/m.  See SRA.  Sleep:  Number of Hours: 7.5    Treatment Plan Summary: Daily contact with patient to assess and evaluate symptoms and progress in treatment and Medication management   Ms. Bajorek is a 26 year old female with a history of schizophrenia admitted for psychotic break in the context of treatment discontinuation for several days.  1. Psychosis. She has been maintained on a combination of Zyprexa, Abilify, and low dose Latuda in the community but  that voices were still not controlled. We restarted Zyprexa 30 mg nightly and Prolixin 5 mg 3 times daily for psychosis. We restarted lithium 300 mg 3 times daily for mood stabilization. Lithium level in 2 days.  2. Insomnia. She is on Restoril.  3. Smoking. Nicotine patch is available.  4. Metabolic syndrome monitoring. Lipid panel, TSH, and hemoglobin A1c are pending.  5. EKG. Pending.  6. Disposition. The patient does not want to return to her own home where she feels mistreated. Apparently her father found a new facility already. Social worker to contact the family. She will follow up with Dr. Janeece Riggers at [facility].   Observation Level/Precautions:  15 minute checks  Laboratory:  CBC Chemistry Profile UDS UA  Psychotherapy:    Medications:    Consultations:    Discharge Concerns:    Estimated LOS:  Other:     Physician Treatment Plan for Primary Diagnosis: Schizophrenia, paranoid (HCC) Long Term Goal(s): Improvement in symptoms so as ready for discharge  Short Term Goals: Ability to identify changes in lifestyle to reduce recurrence of condition will improve, Ability to verbalize feelings will improve, Ability to disclose and discuss suicidal ideas, Ability to demonstrate self-control will improve, Ability to identify and develop effective coping behaviors will improve, Ability to maintain clinical measurements within normal limits will improve, Compliance with prescribed medications will improve and Ability to identify triggers associated with substance abuse/mental health issues will improve  Physician Treatment Plan for Secondary Diagnosis: Principal Problem:   Schizophrenia, paranoid (HCC) Active Problems:   Cocaine use disorder, moderate, dependence (HCC)   Noncompliance   Tobacco use disorder  Long Term Goal(s): NA  Short Term Goals: NA  I certify that inpatient services furnished can reasonably be expected to improve the patient's condition.    Kristine Linea,  MD 10/2/201812:18 PM

## 2017-09-13 NOTE — Progress Notes (Signed)
Pt remains sad and depressed. Pt states she is hearing voices telling her she is worthless. Pt states they are not telling her to harm herself, just putting her down. Pt was encouraged to verbalize some positive things about self. Pt could not verbalize anything at this time. Pt was encouraged to verbalize at least one positive about self before shift has ended. Denies SI, HI or visual hallucinations. Pt is med compliant, no adverse affects noted. Contracted to safety. 15 min safety checks continues.

## 2017-09-13 NOTE — [facility] Suicide Risk Assessment (Signed)
[facility] Admission Suicide Risk Assessment   Nursing information obtained from:  Patient Demographic factors:  Low socioeconomic status, Unemployed Current Mental Status:  NA Loss Factors:  NA Historical Factors:  Impulsivity Risk Reduction Factors:  NA  Total Time spent with patient: 1 hour Principal Problem: Schizophrenia, paranoid (HCC) Diagnosis:   Patient Active Problem List   Diagnosis Date Noted  . Schizophrenia (HCC) [F20.9] 09/12/2017  . Cocaine use disorder, moderate, dependence (HCC) [F14.20] 09/12/2017  . Noncompliance [Z91.19] 09/12/2017  . Schizophrenia, paranoid (HCC) [F20.0] 09/12/2017  . Tobacco use disorder [F17.200] 09/12/2017   Subjective Data: psychotic break.  Continued Clinical Symptoms:  Alcohol Use Disorder Identification Test Final Score (AUDIT): 2 The "Alcohol Use Disorders Identification Test", Guidelines for Use in Primary Care, Second Edition.  World Science writer [facility]). Score between 0-7:  no or low risk or alcohol related problems. Score between 8-15:  moderate risk of alcohol related problems. Score between 16-19:  high risk of alcohol related problems. Score 20 or above:  warrants further diagnostic evaluation for alcohol dependence and treatment.   CLINICAL FACTORS:   Alcohol/Substance Abuse/Dependencies Schizophrenia:   Command hallucinatons Less than 32 years old Paranoid or undifferentiated type   Musculoskeletal: Strength & Muscle Tone: within normal limits Gait & Station: normal Patient leans: N/A  Psychiatric Specialty Exam: Physical Exam  Nursing note and vitals reviewed. Psychiatric: Her speech is normal. Her mood appears anxious. Her affect is blunt. She is withdrawn and actively hallucinating. Thought content is paranoid. Cognition and memory are normal. She expresses impulsivity.    Review of Systems  Constitutional: Negative.   HENT: Negative.   Eyes: Negative.   Respiratory: Negative.   Cardiovascular: Negative.    Gastrointestinal: Negative.   Genitourinary: Negative.   Musculoskeletal: Negative.   Skin: Negative.   Neurological: Negative.   Endo/Heme/Allergies: Negative.   Psychiatric/Behavioral: Positive for hallucinations and substance abuse. The patient is nervous/anxious and has insomnia.     Blood pressure 97/75, pulse 88, temperature 97.8 F (36.6 C), temperature source Oral, resp. rate 16, height  (1.651 m), weight 95.3 kg (210 lb), SpO2 100 %.Body mass index is 34.95 kg/m.  General Appearance: Casual  Eye Contact:  Poor  Speech:  Clear and Coherent  Volume:  Decreased  Mood:  Anxious  Affect:  Blunt  Thought Process:  Goal Directed and Descriptions of Associations: Intact  Orientation:  Full (Time, Place, and Person)  Thought Content:  Delusions, Hallucinations: Auditory and Paranoid Ideation  Suicidal Thoughts:  No  Homicidal Thoughts:  No  Memory:  Immediate;   Fair Recent;   Fair Remote;   Fair  Judgement:  Poor  Insight:  Shallow  Psychomotor Activity:  Decreased  Concentration:  Concentration: Fair and Attention Span: Fair  Recall:  [employer] of Knowledge:  Fair  Language:  Fair  Akathisia:  No  Handed:  Right  AIMS (if indicated):     Assets:  Communication Skills Desire for Improvement Financial Resources/Insurance Housing Physical Health Resilience Social Support  ADL's:  Intact  Cognition:  WNL  Sleep:  Number of Hours: 7.5      COGNITIVE FEATURES THAT CONTRIBUTE TO RISK:  None    SUICIDE RISK:   Moderate:  Frequent suicidal ideation with limited intensity, and duration, some specificity in terms of plans, no associated intent, good self-control, limited dysphoria/symptomatology, some risk factors present, and identifiable protective factors, including available and accessible social support.  PLAN OF CARE: Hospital admission, medication management, substance abuse counseling,  discharge planning.  Ms. Hosack is a 26 year old female with a  history of schizophrenia admitted for psychotic break in the context of treatment discontinuation for several days.  1. Psychosis. She has been maintained on a combination of Zyprexa, Abilify, and low dose Latuda in the community but that voices were still not controlled. We restarted Zyprexa 30 mg nightly and Prolixin 5 mg 3 times daily for psychosis. We restarted lithium 300 mg 3 times daily for mood stabilization. Lithium level in 2 days.  2. Insomnia. She is on Restoril.  3. Smoking. Nicotine patch is available.  4. Metabolic syndrome monitoring. Lipid panel, TSH, and hemoglobin A1c are pending.  5. EKG. Pending.  6. Disposition. The patient does not want to return to her own home where she feels mistreated. Apparently her father found a new facility already. Social worker to contact the family. She will follow up with Dr. Janeece Riggers at [facility].  I certify that inpatient services furnished can reasonably be expected to improve the patient's condition.   Kristine Linea, MD 09/13/2017, 12:11 PM

## 2017-09-13 NOTE — Tx Team (Signed)
Initial Treatment Plan 09/13/2017 2:01 PM [name] XBJ:[id number]    PATIENT STRESSORS: Medication change or noncompliance   PATIENT STRENGTHS: Ability for insight Close to her dog  PATIENT IDENTIFIED PROBLEMS:                      DISCHARGE CRITERIA:  Ability to meet basic life and health needs Adequate post-discharge living arrangements  PRELIMINARY DISCHARGE PLAN: Attend aftercare/continuing care group Participate in family therapy  PATIENT/FAMILY INVOLVEMENT: This treatment plan has been presented to and reviewed with the patient, Laura Briggs, and/or family member.  The patient and family have been given the opportunity to ask questions and make suggestions.  Rex Kras, RN 09/13/2017, 2:01 PM

## 2017-09-13 NOTE — Plan of Care (Signed)
Problem: Education: Goal: Will be free of psychotic symptoms Outcome: Not Progressing Laura Briggs continues to endorse voices telling her degrading  Statements. Goal: Knowledge of the prescribed therapeutic regimen will improve Outcome: Progressing Laura Briggs has been compliant with her medication regimen.

## 2017-09-13 NOTE — Progress Notes (Signed)
D: Pt denies SI/HI/AVH, affect is flat but brightens upon approach. Patient's thoughts are organized , she isolative to self  In the room with poor eye contact noted. Pt is pleasant and cooperative. Pt  appears less anxious, no paranoia noted, nteracting with staff appropriately.  A: Pt was offered support and encouragement. Pt was given scheduled medications. Pt was encouraged to attend groups. Q 15 minute checks were done for safety.  R:Pt did not attend evening group. Pt is complaint with medication. Pt receptive to treatment and safety maintained on unit.

## 2017-09-13 NOTE — [facility] Group Notes (Signed)
[facility] Group Notes:  (Nursing/MHT/Case Management/Adjunct)  Date:  09/13/2017  Time:  3:10 PM  Type of Therapy:  Psychoeducational Skills  Participation Level:  Did Not Attend  Lynelle Smoke [facility] 09/13/2017, 3:10 PM

## 2017-09-13 NOTE — Plan of Care (Signed)
Problem: Safety: Goal: Ability to remain free from injury will improve Outcome: Progressing Pt will remain injury free entire shift.   

## 2017-09-13 NOTE — Progress Notes (Signed)
Received Laura Briggs this am in her room after breakfast. She remained in bed most of the day except for meals and medications. She continues to endorse auditory hallucinations speaking negative. The voices have decreased throughout the day. She denied feeling suicidal, depressed and anxious although her affect is flat.

## 2017-09-13 NOTE — [facility] Group Notes (Signed)
LCSW Group Therapy Note  09/13/2017 3pm  Type of Therapy/Topic:  Group Therapy:  Emotion Regulation  Participation Level:  Did Not Attend   Description of Group:   The purpose of this group is to assist patients in learning to regulate negative emotions and experience positive emotions. Patients will be guided to discuss ways in which they have been vulnerable to their negative emotions. These vulnerabilities will be juxtaposed with experiences of positive emotions or situations, and patients will be challenged to use positive emotions to combat negative ones. Special emphasis will be placed on coping with negative emotions in conflict situations, and patients will process healthy conflict resolution skills.  Therapeutic Goals: 1. Patient will identify two positive emotions or experiences to reflect on in order to balance out negative emotions 2. Patient will label two or more emotions that they find the most difficult to experience 3. Patient will demonstrate positive conflict resolution skills through discussion and/or role plays  Summary of Patient Progress:       Therapeutic Modalities:   Cognitive Behavioral Therapy Feelings Identification Dialectical Behavioral Therapy   Glennon Mac, LCSW 09/13/2017 4:54 PM

## 2017-09-13 NOTE — Plan of Care (Signed)
Problem: Coping: Goal: Ability to verbalize feelings will improve Outcome: Progressing Patient verbalized feelings to staff during medication time.

## 2017-09-14 MED ORDER — FLUPHENAZINE HCL 5 MG PO TABS
10.0000 mg | ORAL_TABLET | Freq: Two times a day (BID) | ORAL | Status: DC
  Administered 2017-09-14 – 2017-09-24 (×20): 10 mg via ORAL
  Filled 2017-09-14 (×20): qty 2

## 2017-09-14 MED ORDER — LITHIUM CARBONATE ER 450 MG PO TBCR
450.0000 mg | EXTENDED_RELEASE_TABLET | Freq: Two times a day (BID) | ORAL | Status: DC
  Administered 2017-09-14 – 2017-09-24 (×20): 450 mg via ORAL
  Filled 2017-09-14 (×20): qty 1

## 2017-09-14 NOTE — Progress Notes (Signed)
Pt is affect is sad. Pt denies depression. Pt denies SI,HI or A/V hallucinations. Pt was very isolative staying in room sleeping except for meds. Pt says she isn't hearing voices at this time. Pt is very quiet and soft spoken. Pt was encouraged to interact with peers in dayroom, pt said she just wanted to go back to her room and sleep. Contracted to safety. Pt is med compliant. At the night med pass pt didn't not want to come to med room for meds, meds were taking to pt and she did take them. 15 min safety checks continues.

## 2017-09-14 NOTE — Progress Notes (Signed)
[facility] MD Progress Note  09/14/2017 2:37 PM Laura Briggs  MRN:  [id number]  Subjective:  Identifying data. [name] is a 26 year old female with history of schizophrenia.  Chief complaint. "Do you think it is my voice."  History of present illness. Information was obtained from the patient and the chart. The patient came to the emergency room after she eloped from her group home for 2 days, staying with her friend, not taking medications, taking "Molly". She became acutely psychotic with loud demeaning auditory hallucinations and paranoia. She called her father and was brought to the emergency room. The patient has a long history of schizophrenia. She has been maintained on a combination of lithium, Abilify, Zyprexa, and Latuda in the care of Dr. Kasandra Knudsen. She has been working with Dr. Kasandra Knudsen for the past 6 months but her auditory hallucinations have never gone away. She has been dating her current group home for 6 months as well. She believes that they have always mistreated her and the situation has gotten worse recently forcing her to run away. She denies depressive symptoms or symptoms suggestive of bipolar mania. She has anxiety attacks sometimes twice a day depending on the strength and content of her voices. The voices never go away. At best she hears noises that she compares to football game crowd. She denies PTSD or OCD symptoms. While staying with a friend for 2 days she says she used marijuana and Molly.   Past psychiatric history. She was diagnosed with mental illness which she was in college at the age of 31. She has 3 prior hospitalization. She has been tried on multiple medications with no success. She has tremor from Haldol and liver problems. Depakote. Her current regimen in LITHIUM and 3 second generation antipsychotics. She denies ever attempting suicide.  Family psychiatric history. Nonreported.  Social history. She is an incompetent adult. Her father is the guardian. She has been  at her current group home for 6 months but does not want to return there. Reportedly her father has already identified another facility. She is disabled from mental illness  09/14/2017. Laura Briggs met with treatment team today. She reports no improvement. She still has bad hallucinations. Her affect is flat. She says that his little. I spoke with her father at length to discuss treatment with clozapine. The family is frightened of possible risks. He promised to talk to her mother to help him decide. The patient is in favor of clozapine treatment. The father is worried that the new group home he arranged for may not want to take the patient now after she eloped. The patient has no complaints. She is in bed all day. She does not participate in programming.  Per nursing: Pt remains sad and depressed. Pt states she is hearing voices telling her she is worthless. Pt states they are not telling her to harm herself, just putting her down. Pt was encouraged to verbalize some positive things about self. Pt could not verbalize anything at this time. Pt was encouraged to verbalize at least one positive about self before shift has ended. Denies SI, HI or visual hallucinations. Pt is med compliant, no adverse affects noted. Contracted to safety. 15 min safety checks continues.  Principal Problem: Schizophrenia, paranoid ([place]) Diagnosis:   Patient Active Problem List   Diagnosis Date Noted  . Schizophrenia ([place]) [F20.9] 09/12/2017  . Cocaine use disorder, moderate, dependence ([place]) [F14.20] 09/12/2017  . Noncompliance [Z91.19] 09/12/2017  . Schizophrenia, paranoid ([place]) [F20.0] 09/12/2017  . Tobacco use  disorder [F17.200] 09/12/2017   Total Time spent with patient: 30 minutes  Past Medical History:  Past Medical History:  Diagnosis Date  . Anemia    History reviewed. No pertinent surgical history. Family History: History reviewed. No pertinent family history.  Social History:  History  Alcohol Use  .  Yes     History  Drug Use  . Types: Other-see comments    Comment: molly per patient    Social History   Social History  . Marital status: Single    Spouse name: N/A  . Number of children: N/A  . Years of education: N/A   Social History Main Topics  . Smoking status: Current Every Day Smoker    Packs/day: 0.50    Years: 1.00    Types: Cigarettes    Start date: 09/13/2016  . Smokeless tobacco: Current User  . Alcohol use Yes  . Drug use: Yes    Types: Other-see comments     Comment: molly per patient  . Sexual activity: Not Asked   Other Topics Concern  . None   Social History Narrative  . None   Additional Social History:                         Sleep: Fair  Appetite:  Fair  Current Medications: Current Facility-Administered Medications  Medication Dose Route Frequency Provider Last Rate Last Dose  . acetaminophen (TYLENOL) tablet 650 mg  650 mg Oral Q6H PRN Clapacs, John T, MD      . alum & mag hydroxide-simeth (MAALOX/MYLANTA) 200-200-20 MG/5ML suspension 30 mL  30 mL Oral Q4H PRN Clapacs, John T, MD      . diphenhydrAMINE (BENADRYL) capsule 50 mg  50 mg Oral Q6H PRN Clapacs, John T, MD      . fluPHENAZine (PROLIXIN) tablet 5 mg  5 mg Oral Q8H Temiloluwa Recchia B, MD   5 mg at 09/14/17 1304  . lithium carbonate (LITHOBID) CR tablet 300 mg  300 mg Oral TID AC Chrishana Spargur B, MD   300 mg at 09/14/17 1259  . magnesium hydroxide (MILK OF MAGNESIA) suspension 30 mL  30 mL Oral Daily PRN Clapacs, John T, MD      . nicotine (NICODERM CQ - dosed in mg/24 hours) patch 21 mg  21 mg Transdermal Daily Wallace Gappa B, MD      . OLANZapine (ZYPREXA) tablet 30 mg  30 mg Oral QHS Newman Waren B, MD   30 mg at 09/13/17 2129  . temazepam (RESTORIL) capsule 15 mg  15 mg Oral QHS Danaisha Celli B, MD   15 mg at 09/13/17 2129    Lab Results:  Results for orders placed or performed during the hospital encounter of 09/12/17 (from the past 48  hour(s))  Hemoglobin A1c     Status: None   Collection Time: 09/13/17 10:08 AM  Result Value Ref Range   Hgb A1c MFr Bld 5.1 4.8 - 5.6 %    Comment: (NOTE) Pre diabetes:          5.7%-6.4% Diabetes:              >6.4% Glycemic control for   <7.0% adults with diabetes    Mean Plasma Glucose 99.67 mg/dL    Comment: Performed at [facility], [address] [place], [place] [address]  Lipid panel     Status: Abnormal   Collection Time: 09/13/17 10:08 AM  Result Value Ref  Range   Cholesterol 205 (H) 0 - 200 mg/dL   Triglycerides 198 (H) <150 mg/dL   HDL 30 (L) >40 mg/dL   Total CHOL/HDL Ratio 6.8 RATIO   VLDL 40 0 - 40 mg/dL   LDL Cholesterol 135 (H) 0 - 99 mg/dL    Comment:        Total Cholesterol/HDL:CHD Risk Coronary Heart Disease Risk Table                     Men   Women  1/2 Average Risk   3.4   3.3  Average Risk       5.0   4.4  2 X Average Risk   9.6   7.1  3 X Average Risk  23.4   11.0        Use the calculated Patient Ratio above and the CHD Risk Table to determine the patient's CHD Risk.        ATP III CLASSIFICATION (LDL):  <100     mg/dL   Optimal  100-129  mg/dL   Near or Above                    Optimal  130-159  mg/dL   Borderline  160-189  mg/dL   High  >190     mg/dL   Very High   TSH     Status: None   Collection Time: 09/13/17 10:08 AM  Result Value Ref Range   TSH 0.656 0.350 - 4.500 uIU/mL    Comment: Performed by a 3rd Generation assay with a functional sensitivity of <=0.01 uIU/mL.    Blood Alcohol level:  Lab Results  Component Value Date   ETH <10 28/41/3244    Metabolic Disorder Labs: Lab Results  Component Value Date   HGBA1C 5.1 09/13/2017   MPG 99.67 09/13/2017   No results found for: PROLACTIN Lab Results  Component Value Date   CHOL 205 (H) 09/13/2017   TRIG 198 (H) 09/13/2017   HDL 30 (L) 09/13/2017   CHOLHDL 6.8 09/13/2017   VLDL 40 09/13/2017   LDLCALC 135 (H) 09/13/2017    Physical Findings: AIMS:  , ,   ,  ,    CIWA:    COWS:     Musculoskeletal: Strength & Muscle Tone: within normal limits Gait & Station: normal Patient leans: N/A  Psychiatric Specialty Exam: Physical Exam  Nursing note and vitals reviewed. Psychiatric: Her speech is normal. Her affect is blunt. She is withdrawn and actively hallucinating. Thought content is paranoid and delusional. Cognition and memory are normal. She expresses impulsivity.    Review of Systems  Constitutional: Negative.   HENT: Negative.   Eyes: Negative.   Respiratory: Negative.   Cardiovascular: Negative.   Gastrointestinal: Negative.   Genitourinary: Negative.   Musculoskeletal: Negative.   Skin: Negative.   Neurological: Negative.   Endo/Heme/Allergies: Negative.   Psychiatric/Behavioral: Positive for hallucinations and substance abuse.    Blood pressure 100/74, pulse 96, temperature 98.4 F (36.9 C), temperature source Oral, resp. rate 18, height '5\' 5"'$  (1.651 m), weight 95.3 kg (210 lb), SpO2 100 %.Body mass index is 34.95 kg/m.  General Appearance: Bizarre  Eye Contact:  Poor  Speech:  Clear and Coherent  Volume:  Normal  Mood:  Anxious  Affect:  Blunt  Thought Process:  Goal Directed and Descriptions of Associations: Intact  Orientation:  Full (Time, Place, and Person)  Thought Content:  Delusions, Hallucinations: Auditory and Paranoid  Ideation  Suicidal Thoughts:  No  Homicidal Thoughts:  No  Memory:  Immediate;   Fair Recent;   Fair Remote;   Fair  Judgement:  Poor  Insight:  Lacking  Psychomotor Activity:  Decreased  Concentration:  Concentration: Fair and Attention Span: Fair  Recall:  [employer] of Knowledge:  Fair  Language:  Fair  Akathisia:  No  Handed:  Right  AIMS (if indicated):     Assets:  Communication Skills Desire for Improvement Financial Resources/Insurance Housing Physical Health Resilience Social Support  ADL's:  Intact  Cognition:  WNL  Sleep:  Number of Hours: 7.45     Treatment Plan  Summary: Daily contact with patient to assess and evaluate symptoms and progress in treatment and Medication management   Laura Briggs is a 26 year old female with a history of schizophrenia admitted for psychotic break in the context of treatment discontinuation for several days.  1. Psychosis. She has been maintained on a combination of Zyprexa, Abilify, and low dose Latuda in the community but that voices were still not controlled. We restarted Zyprexa 30 mg nightly and Prolixin 5 mg 3 times daily for psychosis. We restarted lithium 300 mg 3 times daily for mood stabilization. Lithium level in 2 days.  2. Insomnia. She is on Restoril.  3. Smoking. Nicotine patch is available.  4. Metabolic syndrome monitoring. Lipid panel, TSH, and hemoglobin A1c are normal.   5. EKG. Sinus rhythm, QTc 418.   6. Disposition. The patient does not want to return to her own home where she feels mistreated. Apparently her father found a new facility already. Social worker to contact the family. She will follow up with Dr. Kasandra Knudsen at [facility].   Orson Slick, MD 09/14/2017, 2:37 PM

## 2017-09-14 NOTE — Progress Notes (Signed)
Patient alert and oriented x 4. Patient denies auditory hallucinations and SI. Patient with sad/flat affect, observed in bed resting with eyes closed majority of shift. Patient is compliant with medications. Patient did not attend group this morning. Appetite is fair, patient encouraged to list two positive things about herself, patient unable to do so at this time. Patient encouraged to think about some positive things about herself and write them down. Safety remains on the unit with q 15 minutes done.

## 2017-09-14 NOTE — [facility] Suicide Risk Assessment (Signed)
[facility] INPATIENT:  Family/Significant Other Suicide Prevention Education  Suicide Prevention Education:  Education Completed; Guardian, father, Alaylah Heatherington,  (name of family member/significant other) has been identified by the patient as the family member/significant other with whom the patient will be residing, and identified as the person(s) who will aid the patient in the event of a mental health crisis (suicidal ideations/suicide attempt).  With written consent from the patient, the family member/significant other has been provided the following suicide prevention education, prior to the and/or following the discharge of the patient.  The suicide prevention education provided includes the following:  Suicide risk factors  Suicide prevention and interventions  National Suicide Hotline telephone number  [facility] assessment telephone number  [facility] Emergency Assistance 911  [facility] and/or Residential Mobile Crisis Unit telephone number  Request made of family/significant other to:  Remove weapons (e.g., guns, rifles, knives), all items previously/currently identified as safety concern.    Remove drugs/medications (over-the-counter, prescriptions, illicit drugs), all items previously/currently identified as a safety concern.  The family member/significant other verbalizes understanding of the suicide prevention education information provided.  The family member/significant other agrees to remove the items of safety concern listed above.  Laura Briggs 09/14/2017, 3:12 PM

## 2017-09-14 NOTE — [facility] Counselor (Signed)
Adult Comprehensive Assessment  Patient ID: Laura Briggs, female   DOB: 1991-07-22, 26 y.o.   MRN: [id number]  Information Source: Information source: Patient (Guardian & group home)  Current Stressors:  Family Relationships: Dad is guardian, strained relationship with her mother. Financial / Lack of resources (include bankruptcy): limited finances Substance abuse: leaves group home at times to use cocaine and marajuana  Living/Environment/Situation:  Living Arrangements: Group Home Living conditions (as described by patient or guardian): Good, group home and guardian have had good working relationship per guardian and group home How long has patient lived in current situation?: since May What is atmosphere in current home: Supportive, Other (Comment) (Pt sees group home as difficult and that she was not treated well there.  Although group home and father report a good working relationship.)  Family History:  Marital status: Single Are you sexually active?:  (suspects that she is when she runs off. Group home verbalizes they worry she is "tricking" when she runs away from group home and uses drugs) Does patient have children?: No  Childhood History:  By whom was/is the patient raised?: Father Description of patient's relationship with caregiver when they were a child: Pt sees her mother as abandoning her when she was 10 because she sent her to live with her father in [place] Patient's description of current relationship with people who raised him/her: good with Dad, not good with mom Does patient have siblings?: Yes Number of Siblings: 5 Description of patient's current relationship with siblings: Pt is the youngest of 6 Did patient suffer any verbal/emotional/physical/sexual abuse as a child?: No Did patient suffer from severe childhood neglect?: No Has patient ever been sexually abused/assaulted/raped as an adolescent or adult?: No Was the patient ever a victim of a crime or a  disaster?: No Witnessed domestic violence?: No Has patient been effected by domestic violence as an adult?: No  Education:  Highest grade of school patient has completed: 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 Currently a student?: No Learning disability?: No  Employment/Work Situation:   Employment situation: On disability Why is patient on disability: Mental Health reasons Patient's job has been impacted by current illness: No Has patient ever been in the [employer]?: No Has patient ever served in combat?: No Did You Receive Any Psychiatric Treatment/Services While in Equities trader?: No Are There Guns or Other Weapons in Your Home?: No Are These Weapons Safely Secured?: No  Financial Resources:   Financial resources: Insurance claims handler Does patient have a Lawyer or guardian?: Yes Merlean Pizzini, father [phone]) Name of representative payee or guardian: see above  Alcohol/Substance Abuse:   What has been your use of drugs/alcohol within the last 12 months?: cocaine and alcohol.   Group home and dad report they believe some marajuana use at times, however she was negative for cannabis on this admission. If attempted suicide, did drugs/alcohol play a role in this?: No Alcohol/Substance Abuse Treatment Hx: Denies past history Has alcohol/substance abuse ever caused legal problems?: No  Social Support System:   Forensic psychologist System: Poor Describe Community Support System: group home residents and staff, father in [place] and mother and all other siblings in [place]  Leisure/Recreation:   Leisure and Hobbies: enjoys reading  Strengths/Needs:   What things does the patient do well?: group home reports she is kind, is very skilled at doing hair  In what areas does patient struggle / problems for patient: making safe choices  Discharge Plan:   Does patient have  access to transportation?: Yes Plan for no access to transportation at discharge:  through group home she does for appointments and such, she was using her phone per group home to make arrangements with random men or "friends" to be picked up and leave. Will patient be returning to same living situation after discharge?: Yes (She can return back until 10/14.  Father has made other arrangements for her in a group home in [place] after this point.) Currently receiving community mental health services: Yes (From Whom) (Father didn't know the name of the Administrator, arts through [employer].  ) If no, would patient like referral for services when discharged?: No Does patient have financial barriers related to discharge medications?: No  Summary/Recommendations:   Summary and Recommendations (to be completed by the evaluator): Pt is 26yo female who comes to the ED after worsening depression and psychosis.  Her father is her guardian and she lives in a group home in the [place].  While on the uniti she will have the opportunity to participate in groups and therapeutic milieu,.  She will have medications managed and assistance with appropriate discharge planning.  It is recommended that she adhere to the agreed upon discharge plan and medication regimen.  Cleda Daub Deasiah Hagberg.LCSW 09/14/2017

## 2017-09-14 NOTE — [facility] Group Notes (Signed)
[facility] Group Notes:  (Nursing/MHT/Case Management/Adjunct)  Date:  09/14/2017  Time:  5:00 PM  Type of Therapy:  Psychoeducational Skills  Participation Level:  Did Not Attend   Laura Briggs Travis Nazifa Trinka 09/14/2017, 5:00 PM 

## 2017-09-14 NOTE — Tx Team (Signed)
Interdisciplinary Treatment and Diagnostic Plan Update  09/14/2017 Time of Session: 10:30am [name] MRN: [id number]  Principal Diagnosis: Schizophrenia, paranoid (HCC)  Secondary Diagnoses: Principal Problem:   Schizophrenia, paranoid (HCC) Active Problems:   Cocaine use disorder, moderate, dependence (HCC)   Noncompliance   Tobacco use disorder   Current Medications:  Current Facility-Administered Medications  Medication Dose Route Frequency Provider Last Rate Last Dose  . acetaminophen (TYLENOL) tablet 650 mg  650 mg Oral Q6H PRN Clapacs, John T, MD      . alum & mag hydroxide-simeth (MAALOX/MYLANTA) 200-200-20 MG/5ML suspension 30 mL  30 mL Oral Q4H PRN Clapacs, John T, MD      . fluPHENAZine (PROLIXIN) tablet 10 mg  10 mg Oral BID AC & HS Pucilowska, Jolanta B, MD      . lithium carbonate (ESKALITH) CR tablet 450 mg  450 mg Oral BID AC & HS Pucilowska, Jolanta B, MD      . magnesium hydroxide (MILK OF MAGNESIA) suspension 30 mL  30 mL Oral Daily PRN Clapacs, John T, MD      . nicotine (NICODERM CQ - dosed in mg/24 hours) patch 21 mg  21 mg Transdermal Daily Pucilowska, Jolanta B, MD      . OLANZapine (ZYPREXA) tablet 30 mg  30 mg Oral QHS Pucilowska, Jolanta B, MD   30 mg at 09/13/17 2129  . temazepam (RESTORIL) capsule 15 mg  15 mg Oral QHS Pucilowska, Jolanta B, MD   15 mg at 09/13/17 2129   PTA Medications: No prescriptions prior to admission.    Patient Stressors: Medication change or noncompliance  Patient Strengths: Ability for insight  Treatment Modalities: Medication Management, Group therapy, Case management,  1 to 1 session with clinician, Psychoeducation, Recreational therapy.   Physician Treatment Plan for Primary Diagnosis: Schizophrenia, paranoid (HCC) Long Term Goal(s): Improvement in symptoms so as ready for discharge NA   Short Term Goals: Ability to identify changes in lifestyle to reduce recurrence of condition will improve Ability to  verbalize feelings will improve Ability to disclose and discuss suicidal ideas Ability to demonstrate self-control will improve Ability to identify and develop effective coping behaviors will improve Ability to maintain clinical measurements within normal limits will improve Compliance with prescribed medications will improve Ability to identify triggers associated with substance abuse/mental health issues will improve NA  Medication Management: Evaluate patient's response, side effects, and tolerance of medication regimen.  Therapeutic Interventions: 1 to 1 sessions, Unit Group sessions and Medication administration.  Evaluation of Outcomes: Progressing  Physician Treatment Plan for Secondary Diagnosis: Principal Problem:   Schizophrenia, paranoid (HCC) Active Problems:   Cocaine use disorder, moderate, dependence (HCC)   Noncompliance   Tobacco use disorder  Long Term Goal(s): Improvement in symptoms so as ready for discharge NA   Short Term Goals: Ability to identify changes in lifestyle to reduce recurrence of condition will improve Ability to verbalize feelings will improve Ability to disclose and discuss suicidal ideas Ability to demonstrate self-control will improve Ability to identify and develop effective coping behaviors will improve Ability to maintain clinical measurements within normal limits will improve Compliance with prescribed medications will improve Ability to identify triggers associated with substance abuse/mental health issues will improve NA     Medication Management: Evaluate patient's response, side effects, and tolerance of medication regimen.  Therapeutic Interventions: 1 to 1 sessions, Unit Group sessions and Medication administration.  Evaluation of Outcomes: Progressing   RN Treatment Plan for Primary Diagnosis: Schizophrenia, paranoid (HCC) Long  Term Goal(s): Knowledge of disease and therapeutic regimen to maintain health will  improve  Short Term Goals: Ability to verbalize feelings will improve, Ability to identify and develop effective coping behaviors will improve and Compliance with prescribed medications will improve  Medication Management: RN will administer medications as ordered by provider, will assess and evaluate patient's response and provide education to patient for prescribed medication. RN will report any adverse and/or side effects to prescribing provider.  Therapeutic Interventions: 1 on 1 counseling sessions, Psychoeducation, Medication administration, Evaluate responses to treatment, Monitor vital signs and CBGs as ordered, Perform/monitor CIWA, COWS, AIMS and Fall Risk screenings as ordered, Perform wound care treatments as ordered.  Evaluation of Outcomes: Progressing   LCSW Treatment Plan for Primary Diagnosis: Schizophrenia, paranoid (HCC) Long Term Goal(s): Safe transition to appropriate next level of care at discharge, Engage patient in therapeutic group addressing interpersonal concerns.  Short Term Goals: Engage patient in aftercare planning with referrals and resources, Increase ability to appropriately verbalize feelings, Facilitate patient progression through stages of change regarding substance use diagnoses and concerns, Identify triggers associated with mental health/substance abuse issues and Increase skills for wellness and recovery  Therapeutic Interventions: Assess for all discharge needs, 1 to 1 time with Social worker, Explore available resources and support systems, Assess for adequacy in community support network, Educate family and significant other(s) on suicide prevention, Complete Psychosocial Assessment, Interpersonal group therapy.  Evaluation of Outcomes: Progressing   Progress in Treatment: Attending groups: Yes. Participating in groups: Yes. Taking medication as prescribed: Yes. Toleration medication: Yes. Family/Significant other contact made: Yes, individual(s)  contacted:  father, guardian Patient understands diagnosis: Yes. Discussing patient identified problems/goals with staff: Yes. Medical problems stabilized or resolved: Yes. Denies suicidal/homicidal ideation: Yes. Issues/concerns per patient self-inventory: Yes. Other:    New problem(s) identified: No, Describe:     New Short Term/Long Term Goal(s): to feel less depressed   Discharge Plan or Barriers: Pt will return to group home, [facility] and follow up with [facility]  Reason for Continuation of Hospitalization: Delusions  Depression Hallucinations Medication stabilization  Estimated Length of Stay:  Attendees: Patient:Laura Briggs 09/14/2017 5:36 PM  Physician: Kristine Linea 09/14/2017 5:36 PM  Nursing: Hulan Amato, RN 09/14/2017 5:36 PM  RN Care Manager: 09/14/2017 5:36 PM  Social Worker: Jake Shark, LCSW 09/14/2017 5:36 PM  Recreational Therapist:  09/14/2017 5:36 PM  Other:  09/14/2017 5:36 PM  Other:  09/14/2017 5:36 PM  Other: 09/14/2017 5:36 PM    Scribe for Treatment Team: Glennon Mac, LCSW 09/14/2017 5:36 PM

## 2017-09-14 NOTE — Plan of Care (Signed)
Problem: Activity: Goal: Will verbalize the importance of balancing activity with adequate rest periods Outcome: Progressing Patient verbalizes understanding the need to get up and participate in activities and rest when needed.  Problem: Education: Goal: Will be free of psychotic symptoms Patient does not exhibit any signs of psychosis at this time. Goal: Knowledge of the prescribed therapeutic regimen will improve Outcome: Progressing Patient is knowledgeable of her medication regimen.  Problem: Coping: Goal: Ability to cope will improve Outcome: Progressing Patient is coping with her current situation and asks questions when needed. Goal: Ability to verbalize feelings will improve Outcome: Progressing Patient has the ability to verbalize her feelings to staff.  Problem: Safety: Goal: Ability to remain free from injury will improve Outcome: Progressing Patient remains free from injury at this time.

## 2017-09-15 LAB — CBC WITH DIFFERENTIAL/PLATELET
BASOS ABS: 0.1 10*3/uL (ref 0–0.1)
Basophils Relative: 1 %
Eosinophils Absolute: 0.2 10*3/uL (ref 0–0.7)
Eosinophils Relative: 2 %
HEMATOCRIT: 42 % (ref 35.0–47.0)
HEMOGLOBIN: 13.7 g/dL (ref 12.0–16.0)
LYMPHS PCT: 33 %
Lymphs Abs: 2.9 10*3/uL (ref 1.0–3.6)
MCH: 26.6 pg (ref 26.0–34.0)
MCHC: 32.6 g/dL (ref 32.0–36.0)
MCV: 81.7 fL (ref 80.0–100.0)
MONO ABS: 0.9 10*3/uL (ref 0.2–0.9)
MONOS PCT: 10 %
NEUTROS ABS: 4.9 10*3/uL (ref 1.4–6.5)
Neutrophils Relative %: 54 %
Platelets: 411 10*3/uL (ref 150–440)
RBC: 5.14 MIL/uL (ref 3.80–5.20)
RDW: 15.5 % — AB (ref 11.5–14.5)
WBC: 9 10*3/uL (ref 3.6–11.0)

## 2017-09-15 NOTE — Progress Notes (Signed)
Received Laura Briggs during breakfast, afterwards she received her medications. Her affect remains flat and she returned to her room thereafter. She stated the voices are gone with a half smile. She talked on the phone in length to female caller. She was encouraged to try to attend one group session today. She went outside for a brief period of time. She was given literature on Clozaril and the title to the movie "Out of Darkness."

## 2017-09-15 NOTE — Progress Notes (Signed)
[facility] MD Progress Note  09/15/2017 2:43 PM Laura Briggs  MRN:  [id number]  Subjective:  Identifying data. Laura Briggs is a 26 year old female with history of schizophrenia.  Chief complaint. "Do you think it is my voice."  History of present illness. Information was obtained from the patient and the chart. The patient came to the emergency room after she eloped from her group home for 2 days, staying with her friend, not taking medications, taking "Molly". She became acutely psychotic with loud demeaning auditory hallucinations and paranoia. She called her father and was brought to the emergency room. The patient has a long history of schizophrenia. She has been maintained on a combination of lithium, Abilify, Zyprexa, and Latuda in the care of Dr. Kasandra Knudsen. She has been working with Dr. Kasandra Knudsen for the past 6 months but her auditory hallucinations have never gone away. She has been dating her current group home for 6 months as well. She believes that they have always mistreated her and the situation has gotten worse recently forcing her to run away. She denies depressive symptoms or symptoms suggestive of bipolar mania. She has anxiety attacks sometimes twice a day depending on the strength and content of her voices. The voices never go away. At best she hears noises that she compares to football game crowd. She denies PTSD or OCD symptoms. While staying with a friend for 2 days she says she used marijuana and Molly.   Past psychiatric history. She was diagnosed with mental illness which she was in college at the age of 89. She has 3 prior hospitalization. She has been tried on multiple medications with no success. She has tremor from Haldol and liver problems. Depakote. Her current regimen in LITHIUM and 3 second generation antipsychotics. She denies ever attempting suicide.  Family psychiatric history. Nonreported.  Social history. She is an incompetent adult. Her father is the guardian. She has been  at her current group home for 6 months but does not want to return there. Reportedly her father has already identified another facility. She is disabled from mental illness  09/14/2017. Laura Briggs met with treatment team today. She reports no improvement. She still has bad hallucinations. Her affect is flat. She says that his little. I spoke with her father at length to discuss treatment with clozapine. The family is frightened of possible risks. He promised to talk to her mother to help him decide. The patient is in favor of clozapine treatment. The father is worried that the new group home he arranged for may not want to take the patient now after she eloped. The patient has no complaints. She is in bed all day. She does not participate in programming.  09/15/2017. Laura Briggs still complains of bad voices and lack of energy. She appears depressed but profound negative symptoms should also b considered. Her guardian so far did not agree to Clozapine Trial.The patient is very suspicious about her medications even though her regimen wa simplified and she receive medications twice daily only. No class partcicipation. Slept better. No somatic complaints, no side effects of medications.  Per nursing: Pt is affect is sad. Pt denies depression. Pt denies SI,HI or A/V hallucinations. Pt was very isolative staying in room sleeping except for meds. Pt says she isn't hearing voices at this time. Pt is very quiet and soft spoken. Pt was encouraged to interact with peers in dayroom, pt said she just wanted to go back to her room and sleep. Contracted to safety. Pt is  med compliant. At the night med pass pt didn't not want to come to med room for meds, meds were taking to pt and she did take them. 15 min safety checks continues.  Principal Problem: Schizophrenia, paranoid ([place]) Diagnosis:   Patient Active Problem List   Diagnosis Date Noted  . Schizophrenia ([place]) [F20.9] 09/12/2017  . Cocaine use disorder,  moderate, dependence ([place]) [F14.20] 09/12/2017  . Noncompliance [Z91.19] 09/12/2017  . Schizophrenia, paranoid ([place]) [F20.0] 09/12/2017  . Tobacco use disorder [F17.200] 09/12/2017   Total Time spent with patient: 30 minutes  Past Medical History:  Past Medical History:  Diagnosis Date  . Anemia    History reviewed. No pertinent surgical history. Family History: History reviewed. No pertinent family history.  Social History:  History  Alcohol Use  . Yes     History  Drug Use  . Types: Other-see comments    Comment: molly per patient    Social History   Social History  . Marital status: Single    Spouse name: N/A  . Number of children: N/A  . Years of education: N/A   Social History Main Topics  . Smoking status: Current Every Day Smoker    Packs/day: 0.50    Years: 1.00    Types: Cigarettes    Start date: 09/13/2016  . Smokeless tobacco: Current User  . Alcohol use Yes  . Drug use: Yes    Types: Other-see comments     Comment: molly per patient  . Sexual activity: Not Asked   Other Topics Concern  . None   Social History Narrative  . None   Additional Social History:                         Sleep: Fair  Appetite:  Fair  Current Medications: Current Facility-Administered Medications  Medication Dose Route Frequency Provider Last Rate Last Dose  . acetaminophen (TYLENOL) tablet 650 mg  650 mg Oral Q6H PRN Clapacs, John T, MD      . alum & mag hydroxide-simeth (MAALOX/MYLANTA) 200-200-20 MG/5ML suspension 30 mL  30 mL Oral Q4H PRN Clapacs, John T, MD      . fluPHENAZine (PROLIXIN) tablet 10 mg  10 mg Oral BID AC & HS Sem Mccaughey B, MD   10 mg at 09/15/17 0814  . lithium carbonate (ESKALITH) CR tablet 450 mg  450 mg Oral BID AC & HS Vedha Tercero B, MD   450 mg at 09/15/17 0814  . magnesium hydroxide (MILK OF MAGNESIA) suspension 30 mL  30 mL Oral Daily PRN Clapacs, John T, MD      . nicotine (NICODERM CQ - dosed in mg/24 hours)  patch 21 mg  21 mg Transdermal Daily Katleen Carraway B, MD      . OLANZapine (ZYPREXA) tablet 30 mg  30 mg Oral QHS Rielynn Trulson B, MD   30 mg at 09/14/17 2124  . temazepam (RESTORIL) capsule 15 mg  15 mg Oral QHS Nasean Zapf B, MD   15 mg at 09/14/17 2124    Lab Results:  No results found for this or any previous visit (from the past 48 hour(s)).  Blood Alcohol level:  Lab Results  Component Value Date   ETH <10 16/09/9603    Metabolic Disorder Labs: Lab Results  Component Value Date   HGBA1C 5.1 09/13/2017   MPG 99.67 09/13/2017   No results found for: PROLACTIN Lab Results  Component Value Date   CHOL  205 (H) 09/13/2017   TRIG 198 (H) 09/13/2017   HDL 30 (L) 09/13/2017   CHOLHDL 6.8 09/13/2017   VLDL 40 09/13/2017   LDLCALC 135 (H) 09/13/2017    Physical Findings: AIMS:  , ,  ,  ,    CIWA:    COWS:     Musculoskeletal: Strength & Muscle Tone: within normal limits Gait & Station: normal Patient leans: N/A  Psychiatric Specialty Exam: Physical Exam  Nursing note and vitals reviewed. Psychiatric: Her speech is normal. Her affect is blunt. She is withdrawn and actively hallucinating. Thought content is paranoid and delusional. Cognition and memory are normal. She expresses impulsivity.    Review of Systems  Constitutional: Negative.   HENT: Negative.   Eyes: Negative.   Respiratory: Negative.   Cardiovascular: Negative.   Gastrointestinal: Negative.   Genitourinary: Negative.   Musculoskeletal: Negative.   Skin: Negative.   Neurological: Negative.   Endo/Heme/Allergies: Negative.   Psychiatric/Behavioral: Positive for hallucinations and substance abuse.    Blood pressure 100/77, pulse 96, temperature 98.4 F (36.9 C), temperature source Oral, resp. rate 18, height _0  (1.651 m), weight 95.3 kg (210 lb), SpO2 100 %.Body mass index is 34.95 kg/m.  General Appearance: Bizarre  Eye Contact:  Poor  Speech:  Clear and Coherent  Volume:   Normal  Mood:  Anxious  Affect:  Blunt  Thought Process:  Goal Directed and Descriptions of Associations: Intact  Orientation:  Full (Time, Place, and Person)  Thought Content:  Delusions, Hallucinations: Auditory and Paranoid Ideation  Suicidal Thoughts:  No  Homicidal Thoughts:  No  Memory:  Immediate;   Fair Recent;   Fair Remote;   Fair  Judgement:  Poor  Insight:  Lacking  Psychomotor Activity:  Decreased  Concentration:  Concentration: Fair and Attention Span: Fair  Recall:  [employer] of Knowledge:  Fair  Language:  Fair  Akathisia:  No  Handed:  Right  AIMS (if indicated):     Assets:  Communication Skills Desire for Improvement Financial Resources/Insurance Housing Physical Health Resilience Social Support  ADL's:  Intact  Cognition:  WNL  Sleep:  Number of Hours: 8     Treatment Plan Summary: Daily contact with patient to assess and evaluate symptoms and progress in treatment and Medication management   Laura Briggs is a 26 year old female with a history of schizophrenia admitted for psychotic break in the context of treatment discontinuation for several days.  1. Psychosis. She has been maintained on a combination of Zyprexa, Abilify, and low dose Latuda in the community but that voices were still not controlled. We restarted Zyprexa 30 mg nightly and Prolixin 5 mg 3 times daily for psychosis. We restarted lithium 300 mg 3 times daily for mood stabilization. Lithium level tomorrow. We discussed Clozapine with her guardian. He is to decide today.  2. Insomnia. She is on Restoril.  3. Smoking. Nicotine patch is available.  4. Metabolic syndrome monitoring. Lipid panel, TSH, and hemoglobin A1c are normal.   5. EKG. Sinus rhythm, QTc 418.   6. Disposition. The patient does not want to return to her group home where she feels mistreated but the guardian recommends that she does until he finds new placement. She will follow up with Dr. Kasandra Knudsen at  [facility]   Orson Slick, MD 09/15/2017, 2:43 PM

## 2017-09-15 NOTE — [facility] Group Notes (Signed)
[facility] Group Notes:  (Nursing/MHT/Case Management/Adjunct)  Date:  09/15/2017  Time:  11:22 PM  Type of Therapy:  Psychoeducational Skills  Participation Level:  Did Not Attend  Summary of Progress/Problems:  Laura Briggs 09/15/2017, 11:22 PM

## 2017-09-15 NOTE — Plan of Care (Signed)
Problem: Education: Goal: Will be free of psychotic symptoms Outcome: Progressing Stated she is feeling better, lengthy conversation on the phone.  Problem: Coping: Goal: Ability to cope will improve Outcome: Progressing Stated the voices are gone today.

## 2017-09-15 NOTE — [facility] Group Notes (Signed)
LCSW Group Therapy Note  09/15/2017 1:15pm  Type of Therapy/Topic:  Group Therapy:  Balance in Life  Participation Level:  Did Not Attend  Description of Group:    This group will address the concept of balance and how it feels and looks when one is unbalanced. Patients will be encouraged to process areas in their lives that are out of balance and identify reasons for remaining unbalanced. Facilitators will guide patients in utilizing problem-solving interventions to address and correct the stressor making their life unbalanced. Understanding and applying boundaries will be explored and addressed for obtaining and maintaining a balanced life. Patients will be encouraged to explore ways to assertively make their unbalanced needs known to significant others in their lives, using other group members and facilitator for support and feedback.  Therapeutic Goals: 1. Patient will identify two or more emotions or situations they have that consume much of in their lives. 2. Patient will identify signs/triggers that life has become out of balance:  3. Patient will identify two ways to set boundaries in order to achieve balance in their lives:  4. Patient will demonstrate ability to communicate their needs through discussion and/or role plays  Summary of Patient Progress:      Therapeutic Modalities:   Cognitive Behavioral Therapy Solution-Focused Therapy Assertiveness Training  Bella Brummet C Daaiyah Baumert, LCSW 09/15/2017 2:23 PM  

## 2017-09-16 LAB — LITHIUM LEVEL: Lithium Lvl: 0.6 mmol/L (ref 0.60–1.20)

## 2017-09-16 MED ORDER — TEMAZEPAM 15 MG PO CAPS
15.0000 mg | ORAL_CAPSULE | Freq: Every evening | ORAL | Status: DC | PRN
  Administered 2017-09-17 – 2017-09-20 (×3): 15 mg via ORAL
  Filled 2017-09-16 (×3): qty 1

## 2017-09-16 NOTE — Progress Notes (Signed)
CSW spoke to Laura Briggs, father, regarding potential for beginning clozapine.  Laura Briggs reports that his wife is not willing to try this medication.  He said that he is in favor of it, but cannot move forward if the wife will not agree. [name], MSW, LCSW Clinical Social Worker 09/16/2017 9:11 AM

## 2017-09-16 NOTE — Progress Notes (Signed)
Isolative to the room.  Unwilling to participate in treatment plan although willing to take medications.  No interaction with staff or peers.  Affect blunted.  Support and encouragement offered.  Safety rounds maintained.

## 2017-09-16 NOTE — Progress Notes (Signed)
D: Pt denies SI/HI/AVH, affect is blunted this evening, mood is irritable, appears tense. Pt is unwilling to participate in treatment plan. Pt appears less anxious, isolative to self, and she is not interacting with peers and staff appropriately.  A: Pt was offered support and encouragement. Pt was given scheduled medications. Pt was encouraged to attend groups. Q 15 minute checks were done for safety.  R:Pt did not attend group. Pt is compliant with medication. Pt is not receptive to treatment on unit, safety maintained on unit.

## 2017-09-16 NOTE — Progress Notes (Addendum)
[facility] MD Progress Note  09/16/2017 10:22 PM Laura Briggs  MRN:  [id number]  Subjective:  Laura Briggs is a 26 year old female with a history of schizophrenia admitted for psychotic break in the context of treatment noncompliance and substance use. She left her group home for the weekend, stopped medications and used drugs. She has been a patient of Dr. Janeece Riggers and has been maintained on a combination of Lithium and three antispychotics.   09/16/2017.Laura Briggs is still depressed and psychotic. She continues to hallucinate. She is in bed all day long. She has passing suicidal thoughts and feels anxious. Yesterday, she wanted to try Clozapine but changed her mind today. Her father, who is the guardian, also opposes. The patient has never been voices-free. She has no somatic complaints or side effects from medications Per nursing report, the patient is compliant with treatment but does not participate in programming.  Treatment plan. We will continue Zyprexa 30 mg nightly and Prolixin10 mg twice daily for psychosis and Lithium 450 mg for mood stabilization. She told me today that she would like to take Abilify maintena. We will have to discuss it with the guardian. She was treated with Abilify in the past. Lithium level 0.6 on 09/16/2017.  Social/disposition. She is disabled from mental illness. She is incompetent adult and her father is her guardian. She will return to her group home while the guardian is looking for new placement.  Past psychiatric history. Diagnosed with schizophrenia while in college. Had multiple hospitalizations and medication trials. Did not tolerate Haldol or Depakote.  Family psychiatric history. Nonreported.  Principal Problem: Schizophrenia, paranoid (HCC) Diagnosis:   Patient Active Problem List   Diagnosis Date Noted  . Schizophrenia (HCC) [F20.9] 09/12/2017  . Cocaine use disorder, moderate, dependence (HCC) [F14.20] 09/12/2017  . Noncompliance [Z91.19] 09/12/2017  .  Schizophrenia, paranoid (HCC) [F20.0] 09/12/2017  . Tobacco use disorder [F17.200] 09/12/2017   Total Time spent with patient: 30 minutes  Past Medical History:  Past Medical History:  Diagnosis Date  . Anemia    History reviewed. No pertinent surgical history. Family History: History reviewed. No pertinent family history.  Social History:  History  Alcohol Use  . Yes     History  Drug Use  . Types: Other-see comments    Comment: molly per patient    Social History   Social History  . Marital status: Single    Spouse name: N/A  . Number of children: N/A  . Years of education: N/A   Social History Main Topics  . Smoking status: Current Every Day Smoker    Packs/day: 0.50    Years: 1.00    Types: Cigarettes    Start date: 09/13/2016  . Smokeless tobacco: Current User  . Alcohol use Yes  . Drug use: Yes    Types: Other-see comments     Comment: molly per patient  . Sexual activity: Not Asked   Other Topics Concern  . None   Social History Narrative  . None   Additional Social History:                         Sleep: Fair  Appetite:  Fair  Current Medications: Current Facility-Administered Medications  Medication Dose Route Frequency Provider Last Rate Last Dose  . acetaminophen (TYLENOL) tablet 650 mg  650 mg Oral Q6H PRN Clapacs, John T, MD      . alum & mag hydroxide-simeth (MAALOX/MYLANTA) 200-200-20 MG/5ML suspension 30 mL  30  mL Oral Q4H PRN Clapacs, John T, MD      . fluPHENAZine (PROLIXIN) tablet 10 mg  10 mg Oral BID AC & HS Pucilowska, Jolanta B, MD   10 mg at 09/16/17 2133  . lithium carbonate (ESKALITH) CR tablet 450 mg  450 mg Oral BID AC & HS Pucilowska, Jolanta B, MD   450 mg at 09/16/17 2134  . magnesium hydroxide (MILK OF MAGNESIA) suspension 30 mL  30 mL Oral Daily PRN Clapacs, John T, MD      . nicotine (NICODERM CQ - dosed in mg/24 hours) patch 21 mg  21 mg Transdermal Daily Pucilowska, Jolanta B, MD      . OLANZapine (ZYPREXA)  tablet 30 mg  30 mg Oral QHS Pucilowska, Jolanta B, MD   30 mg at 09/16/17 2134  . temazepam (RESTORIL) capsule 15 mg  15 mg Oral QHS Pucilowska, Jolanta B, MD   15 mg at 09/16/17 2140    Lab Results:  Results for orders placed or performed during the hospital encounter of 09/12/17 (from the past 48 hour(s))  CBC with Differential/Platelet     Status: Abnormal   Collection Time: 09/15/17  3:20 PM  Result Value Ref Range   WBC 9.0 3.6 - 11.0 K/uL   RBC 5.14 3.80 - 5.20 MIL/uL   Hemoglobin 13.7 12.0 - 16.0 g/dL   HCT 16.1 09.6 - 04.5 %   MCV 81.7 80.0 - 100.0 fL   MCH 26.6 26.0 - 34.0 pg   MCHC 32.6 32.0 - 36.0 g/dL   RDW 40.9 (H) 81.1 - 91.4 %   Platelets 411 150 - 440 K/uL   Neutrophils Relative % 54 %   Neutro Abs 4.9 1.4 - 6.5 K/uL   Lymphocytes Relative 33 %   Lymphs Abs 2.9 1.0 - 3.6 K/uL   Monocytes Relative 10 %   Monocytes Absolute 0.9 0.2 - 0.9 K/uL   Eosinophils Relative 2 %   Eosinophils Absolute 0.2 0 - 0.7 K/uL   Basophils Relative 1 %   Basophils Absolute 0.1 0 - 0.1 K/uL  Lithium level     Status: None   Collection Time: 09/16/17  4:19 PM  Result Value Ref Range   Lithium Lvl 0.60 0.60 - 1.20 mmol/L    Blood Alcohol level:  Lab Results  Component Value Date   ETH <10 09/11/2017    Metabolic Disorder Labs: Lab Results  Component Value Date   HGBA1C 5.1 09/13/2017   MPG 99.67 09/13/2017   No results found for: PROLACTIN Lab Results  Component Value Date   CHOL 205 (H) 09/13/2017   TRIG 198 (H) 09/13/2017   HDL 30 (L) 09/13/2017   CHOLHDL 6.8 09/13/2017   VLDL 40 09/13/2017   LDLCALC 135 (H) 09/13/2017    Physical Findings: AIMS:  , ,  ,  ,    CIWA:    COWS:     Musculoskeletal: Strength & Muscle Tone: within normal limits Gait & Station: normal Patient leans: N/A  Psychiatric Specialty Exam: Physical Exam  Nursing note and vitals reviewed. Psychiatric: Her speech is normal. Her affect is blunt. She is withdrawn and actively  hallucinating. Thought content is paranoid and delusional. Cognition and memory are normal. She expresses impulsivity. She exhibits a depressed mood.    Review of Systems  Neurological: Negative.   Psychiatric/Behavioral: Positive for hallucinations and substance abuse.  All other systems reviewed and are negative.   Blood pressure (!) 88/47, pulse 64, temperature 98.7 F (  37.1 C), temperature source Oral, resp. rate 18, height  (1.651 m), weight 95.3 kg (210 lb), SpO2 100 %.Body mass index is 34.95 kg/m.  General Appearance: Fairly Groomed  Eye Contact:  Fair  Speech:  Clear and Coherent and Slow  Volume:  Decreased  Mood:  Depressed  Affect:  Blunt  Thought Process:  Goal Directed and Descriptions of Associations: Intact  Orientation:  Full (Time, Place, and Person)  Thought Content:  Delusions, Hallucinations: Auditory and Paranoid Ideation  Suicidal Thoughts:  No  Homicidal Thoughts:  No  Memory:  Immediate;   Fair Recent;   Fair Remote;   Fair  Judgement:  Poor  Insight:  Lacking  Psychomotor Activity:  Psychomotor Retardation  Concentration:  Concentration: Poor and Attention Span: Poor  Recall:  Poor  Fund of Knowledge:  Fair  Language:  Fair  Akathisia:  No  Handed:  Right  AIMS (if indicated):     Assets:  Communication Skills Desire for Improvement Financial Resources/Insurance Housing Physical Health Resilience Social Support  ADL's:  Intact  Cognition:  WNL  Sleep:  Number of Hours: 7.75     Treatment Plan Summary: Daily contact with patient to assess and evaluate symptoms and progress in treatment and Medication management   Ms. Koranda is a 26 year old female with a history of schizophrenia admitted for psychotic break in the context of treatment discontinuation for several days.  1. Psychosis. She has been maintained on a combination of Zyprexa, Abilify, and low dose Latuda in the community but her voices were still not controlled. We gave  Zyprexa 30 mg nightly and Prolixin 10 mg bid for psychosis. We restarted lithium 450 bid for mood stabilization. Lithium level 0.6 on 09/16/2017.    2. Insomnia. We will offer Restoril as prn.  3. Smoking. Nicotine patch is available.  4. Metabolic syndrome monitoring. Lipid panel, TSH, and hemoglobin A1c are normal.   5. EKG. Sinus rhythm, QTc 418.   6. Disposition. The patient does not want to return to her group home where she feels mistreated but the guardian recommends that she does until he finds new placement. She will follow up with Dr. Janeece Riggers at [facility].   Kristine Linea, MD 09/16/2017, 10:22 PM

## 2017-09-16 NOTE — [facility] Group Notes (Signed)
[facility] LCSW Group Therapy Note  Date/Time: 09/16/17, 0930  Type of Therapy and Topic:  Group Therapy:  Feelings around Relapse and Recovery  Participation Level:  Did Not Attend   Mood:  Description of Group:    Patients in this group will discuss emotions they experience before and after a relapse. They will process how experiencing these feelings, or avoidance of experiencing them, relates to having a relapse. Facilitator will guide patients to explore emotions they have related to recovery. Patients will be encouraged to process which emotions are more powerful. They will be guided to discuss the emotional reaction significant others in their lives may have to patients' relapse or recovery. Patients will be assisted in exploring ways to respond to the emotions of others without this contributing to a relapse.  Therapeutic Goals: 1. Patient will identify two or more emotions that lead to relapse for them:  2. Patient will identify two emotions that result when they relapse:  3. Patient will identify two emotions related to recovery:  4. Patient will demonstrate ability to communicate their needs through discussion and/or role plays.   Summary of Patient Progress:     Therapeutic Modalities:   Cognitive Behavioral Therapy Solution-Focused Therapy Assertiveness Training Relapse Prevention Therapy  Greg Hilman Kissling, LCSW       

## 2017-09-16 NOTE — Plan of Care (Signed)
Problem: Coping: Goal: Ability to verbalize feelings will improve Outcome: Not Progressing Isolates to room.  No group attendance. Reluctant to talk with staff.  Encouraged to attend groups and come out of room.

## 2017-09-17 DIAGNOSIS — F1420 Cocaine dependence, uncomplicated: Secondary | ICD-10-CM

## 2017-09-17 NOTE — [facility] Group Notes (Signed)
[facility] Group Notes:  (Nursing/MHT/Case Management/Adjunct)  Date:  09/17/2017  Time:  4:51 AM  Type of Therapy:  Psychoeducational Skills  Participation Level:  Did Not Attend  Summary of Progress/Problems:  Laura Briggs 09/17/2017, 4:51 AM

## 2017-09-17 NOTE — Plan of Care (Signed)
Problem: Activity: Goal: Will verbalize the importance of balancing activity with adequate rest periods Outcome: Not Progressing Patient continues to rest for extended amount of hours during the day. Does not participate in group activities.  Problem: Education: Goal: Will be free of psychotic symptoms Outcome: Progressing Patient continues show signs of mental instability  Goal: Knowledge of the prescribed therapeutic regimen will improve Outcome: Progressing Patient is knowledgeable of the prescribed regimen.  Problem: Coping: Goal: Ability to cope will improve Outcome: Not Progressing Patient is not coping well with her current medical condition. Goal: Ability to verbalize feelings will improve Outcome: Not Progressing Patient does not open up and engage in activities with her peers.  Problem: Safety: Goal: Ability to redirect hostility and anger into socially appropriate behaviors will improve Outcome: Progressing Patient does not direct anger towards staff nor her peers. Goal: Ability to remain free from injury will improve Outcome: Progressing Patient remains free from injury.

## 2017-09-17 NOTE — Plan of Care (Signed)
Problem: Coping: Goal: Ability to cope will improve Outcome: Not Progressing Patient responds minimally to interaction with staff.  She is observed in the bed in her room.  She accepted fluids and snack prior to HS medications in her room.  Problem: Safety: Goal: Ability to redirect hostility and anger into socially appropriate behaviors will improve Outcome: Progressing Patient denies SI/HI/AV/H and contracts for safety.  She refuses to come out of the room and denies being afraid.  She was encouraged to drink fluids this evening and was compliant.

## 2017-09-17 NOTE — Plan of Care (Signed)
Problem: Coping: Goal: Ability to cope will improve Outcome: Not Progressing Patient was observed in her room or talking on the telephone.  She seems withdrawn and tended to stay alone in her room.  She had snack in the dayroom and had minimal contact with peers.  Problem: Safety: Goal: Ability to redirect hostility and anger into socially appropriate behaviors will improve Outcome: Progressing Patient was without bizarre or hostile behavior this evening.  She appears worried or preoccupied.  She admitted to "hearing some earlier" when asked about hearing voices.  She denies visual hallucinations and having thoughts of self harm.  She contracts for safety.

## 2017-09-17 NOTE — [facility] Group Notes (Signed)
LCSW Group Therapy Note  09/17/2017 1:00pm  Type of Therapy and Topic:  Group Therapy:  Cognitive Distortions  Participation Level:  Did Not Attend   Description of Group:    Patients in this group will be introduced to the topic of cognitive distortions.  Patients will identify and describe cognitive distortions, describe the feelings these distortions create for them.  Patients will identify one or more situations in their personal life where they have cognitively distorted thinking and will verbalize challenging this cognitive distortion through positive thinking skills.  Patients will practice the skill of using positive affirmations to challenge cognitive distortions using affirmation cards.    Therapeutic Goals:  1. Patient will identify two or more cognitive distortions they have used 2. Patient will identify one or more emotions that stem from use of a cognitive distortion 3. Patient will demonstrate use of a positive affirmation to counter a cognitive distortion through discussion and/or role play. 4. Patient will describe one way cognitive distortions can be detrimental to wellness   Summary of Patient Progress:     Therapeutic Modalities:   Cognitive Behavioral Therapy Motivational Interviewing   Glennon Mac, LCSW 09/17/2017 2:39 PM

## 2017-09-17 NOTE — Progress Notes (Signed)
[facility] MD Progress Note  09/17/2017 6:25 PM Laura Briggs  MRN:  [id number]  Subjective:  Laura Briggs is a 26 year old female with a history of schizophrenia admitted for psychotic break in the context of treatment noncompliance and substance use. She left her group home for the weekend, stopped medications and used drugs. She has been a patient of Dr. Janeece Riggers and has been maintained on a combination of Lithium and three antispychotics.   09/16/2017.Laura Briggs is still depressed and psychotic. She continues to hallucinate. She is in bed all day long. She has passing suicidal thoughts and feels anxious. Yesterday, she wanted to try Clozapine but changed her mind today. Her father, who is the guardian, also opposes. The patient has never been voices-free. She has no somatic complaints or side effects from medications Per nursing report, the patient is compliant with treatment but does not participate in programming.  09/17/17: The patient has been reclusive to her room this morning. She has the covers pulled up over her head and did not want to speak to this writer much. She denies any current active or passive suicidal thoughts. Thought processes appear to be organized and she denied any auditory or visual hallucinations. She attended some groups yesterday but not today. She has come out for meals but otherwise has not interacted much with staff or peers. She is tolerating psychotropic medications fairly well and denies any physical adverse side effects associated with medications. She denies any somatic complaints. Vital signs are stable.  Treatment plan. We will continue Zyprexa 30 mg nightly and Prolixin10 mg twice daily for psychosis and Lithium 450 mg for mood stabilization. She told me today that she would like to take Abilify maintena. We will have to discuss it with the guardian. She was treated with Abilify in the past. Lithium level 0.6 on 09/16/2017.  Social/disposition. She is disabled from  mental illness. She is incompetent adult and her father is her guardian. She will return to her group home while the guardian is looking for new placement.  Past psychiatric history. Diagnosed with schizophrenia while in college. Had multiple hospitalizations and medication trials. Did not tolerate Haldol or Depakote.  Family psychiatric history. Nonreported.  Principal Problem: Schizophrenia, paranoid (HCC) Diagnosis:   Patient Active Problem List   Diagnosis Date Noted  . Schizophrenia (HCC) [F20.9] 09/12/2017  . Cocaine use disorder, moderate, dependence (HCC) [F14.20] 09/12/2017  . Noncompliance [Z91.19] 09/12/2017  . Schizophrenia, paranoid (HCC) [F20.0] 09/12/2017  . Tobacco use disorder [F17.200] 09/12/2017   Total Time spent with patient: 30 minutes  Past Medical History:  Past Medical History:  Diagnosis Date  . Anemia    History reviewed. No pertinent surgical history. Family History: History reviewed. No pertinent family history.  Social History:  History  Alcohol Use  . Yes     History  Drug Use  . Types: Other-see comments    Comment: molly per patient    Social History   Social History  . Marital status: Single    Spouse name: N/A  . Number of children: N/A  . Years of education: N/A   Social History Main Topics  . Smoking status: Current Every Day Smoker    Packs/day: 0.50    Years: 1.00    Types: Cigarettes    Start date: 09/13/2016  . Smokeless tobacco: Current User  . Alcohol use Yes  . Drug use: Yes    Types: Other-see comments     Comment: molly per patient  . Sexual activity:  Not Asked   Other Topics Concern  . None   Social History Narrative  . None        Sleep: Fair, sleeping during the daytime  Appetite:  Fair  Current Medications: Current Facility-Administered Medications  Medication Dose Route Frequency Provider Last Rate Last Dose  . acetaminophen (TYLENOL) tablet 650 mg  650 mg Oral Q6H PRN Clapacs, John T, MD      .  alum & mag hydroxide-simeth (MAALOX/MYLANTA) 200-200-20 MG/5ML suspension 30 mL  30 mL Oral Q4H PRN Clapacs, John T, MD      . fluPHENAZine (PROLIXIN) tablet 10 mg  10 mg Oral BID AC & HS Pucilowska, Jolanta B, MD   10 mg at 09/17/17 0820  . lithium carbonate (ESKALITH) CR tablet 450 mg  450 mg Oral BID AC & HS Pucilowska, Jolanta B, MD   450 mg at 09/17/17 0820  . magnesium hydroxide (MILK OF MAGNESIA) suspension 30 mL  30 mL Oral Daily PRN Clapacs, John T, MD      . nicotine (NICODERM CQ - dosed in mg/24 hours) patch 21 mg  21 mg Transdermal Daily Pucilowska, Jolanta B, MD      . OLANZapine (ZYPREXA) tablet 30 mg  30 mg Oral QHS Pucilowska, Jolanta B, MD   30 mg at 09/16/17 2134  . temazepam (RESTORIL) capsule 15 mg  15 mg Oral QHS PRN Pucilowska, Jolanta B, MD        Lab Results:  Results for orders placed or performed during the hospital encounter of 09/12/17 (from the past 48 hour(s))  Lithium level     Status: None   Collection Time: 09/16/17  4:19 PM  Result Value Ref Range   Lithium Lvl 0.60 0.60 - 1.20 mmol/L    Blood Alcohol level:  Lab Results  Component Value Date   ETH <10 09/11/2017    Metabolic Disorder Labs: Lab Results  Component Value Date   HGBA1C 5.1 09/13/2017   MPG 99.67 09/13/2017   No results found for: PROLACTIN Lab Results  Component Value Date   CHOL 205 (H) 09/13/2017   TRIG 198 (H) 09/13/2017   HDL 30 (L) 09/13/2017   CHOLHDL 6.8 09/13/2017   VLDL 40 09/13/2017   LDLCALC 135 (H) 09/13/2017     Musculoskeletal: Strength & Muscle Tone: within normal limits Gait & Station: normal Patient leans: N/A  Psychiatric Specialty Exam: Physical Exam  Nursing note and vitals reviewed. Psychiatric: Her speech is normal. Her affect is blunt. She is withdrawn. Cognition and memory are normal. She exhibits a depressed mood.    Review of Systems  Neurological: Negative.   Psychiatric/Behavioral: Positive for depression.  All other systems reviewed and  are negative.   Blood pressure (!) 88/47, pulse 64, temperature 98.7 F (37.1 C), temperature source Oral, resp. rate 18, height  (1.651 m), weight 95.3 kg (210 lb), SpO2 100 %.Body mass index is 34.95 kg/m.  General Appearance: Fairly Groomed  Eye Contact:  Fair  Speech:  Clear and Coherent and Slow  Volume:  Decreased  Mood:  Depressed  Affect:  Blunt  Thought Process:  Goal Directed and Descriptions of Associations: Intact  Orientation:  Full (Time, Place, and Person)  Thought Content:  She is denying any hallucinations and paranoid thoughts have decreased  Suicidal Thoughts:  No  Homicidal Thoughts:  No  Memory:  Immediate;   Fair Recent;   Fair Remote;   Fair  Judgement:  Poor  Insight:  Lacking  Psychomotor  Activity:  Psychomotor Retardation  Concentration:  Concentration: Poor and Attention Span: Poor  Recall:  Poor  Fund of Knowledge:  Fair  Language:  Fair  Akathisia:  No  Handed:  Right  AIMS (if indicated):     Assets:  Communication Skills Desire for Improvement Financial Resources/Insurance Housing Physical Health Resilience Social Support  ADL's:  Intact  Cognition:  WNL  Sleep:  Number of Hours: 7.45     Treatment Plan Summary: Daily contact with patient to assess and evaluate symptoms and progress in treatment and Medication management   Laura Briggs is a 26 year old female with a history of schizophrenia admitted for psychotic break in the context of treatment discontinuation for several days.  1. Psychosis. She has been maintained on a combination of Zyprexa, Abilify, and low dose Latuda in the community but her voices were still not controlled. We gave Zyprexa 30 mg nightly and Prolixin 10 mg bid for psychosis. We restarted lithium 450 bid for mood stabilization. Lithium level 0.6 on 09/16/2017.    2. Insomnia. We will offer Restoril as prn.  3. Smoking. Nicotine patch is available.  4. Metabolic syndrome monitoring. Lipid panel, TSH,  and hemoglobin A1c are normal.   5. EKG. Sinus rhythm, QTc 418.   6. Disposition. The patient does not want to return to her group home where she feels mistreated but the guardian recommends that she does until he finds new placement. She will follow up with Dr. Janeece Riggers at [facility].   Levora Angel, MD 09/17/2017, 6:25 PM

## 2017-09-17 NOTE — Progress Notes (Signed)
Patient isolative to is room, was observed laying in bed with covers over her head. Writer had to call patient several times for her to wake up and take her medications. She appeared flat in affect and avoided eye contact.  She required prompts to answer any questions and looked down. She denied having thoughts of self-harm. Patient also denied  AH saying "I am not hearing anything."

## 2017-09-18 MED ORDER — NICOTINE POLACRILEX 2 MG MT GUM
2.0000 mg | CHEWING_GUM | OROMUCOSAL | Status: DC | PRN
Start: 2017-09-18 — End: 2017-09-23
  Administered 2017-09-18 – 2017-09-21 (×3): 2 mg via ORAL
  Filled 2017-09-18 (×4): qty 1

## 2017-09-18 NOTE — Progress Notes (Signed)
[facility] MD Progress Note  09/18/2017 3:00 PM Laura Briggs  MRN:  [id number]  Subjective:  Laura Briggs is a 26 year old female with a history of schizophrenia admitted for psychotic break in the context of treatment noncompliance and substance use. She left her group home for the weekend, stopped medications and used drugs. She has been a patient of Dr. Janeece Riggers and has been maintained on a combination of Lithium and three antispychotics.   09/16/2017.Laura Briggs is still depressed and psychotic. She continues to hallucinate. She is in bed all day long. She has passing suicidal thoughts and feels anxious. Yesterday, she wanted to try Clozapine but changed her mind today. Her father, who is the guardian, also opposes. The patient has never been voices-free. She has no somatic complaints or side effects from medications Per nursing report, the patient is compliant with treatment but does not participate in programming.  09/17/17: The patient has been reclusive to her room this morning. She has the covers pulled up over her head and did not want to speak to this writer much. She denies any current active or passive suicidal thoughts. Thought processes appear to be organized and she denied any auditory or visual hallucinations. She attended some groups yesterday but not today. She has come out for meals but otherwise has not interacted much with staff or peers. She is tolerating psychotropic medications fairly well and denies any physical adverse side effects associated with medications. She denies any somatic complaints. Vital signs are stable.  09/18/17: The patient was willing to get out of her room this morning and talk with this Clinical research associate. She also went outside with the other patients. She says she is no longer having any auditory hallucinations but at the time of admission, the voices were very "judgmental". She admitted to using multiple drugs prior to admission at a party at [employer] in [place].  Today, she says her mood is "better" but she is anxious about going to a group home. She denies any current active or passive suicidal thoughts but affect remains blunted and she is isolative from other patients. She denies any somatic complaints and is tolerating psychotropic medications fairly well. Vital signs are stable. Appetite is fair. She denies any problems with insomnia and slept over 7 hours last night. She has been sleeping on and off throughout the daytime as well.  Treatment plan. We will continue Zyprexa 30 mg nightly and Prolixin10 mg twice daily for psychosis and Lithium 450 mg for mood stabilization. She told me today that she would like to take Abilify maintena. We will have to discuss it with the guardian. She was treated with Abilify in the past. Lithium level 0.6 on 09/16/2017.  Social/disposition. She is disabled from mental illness. She is incompetent adult and her father is her guardian. She will return to her group home while the guardian is looking for new placement.  Past psychiatric history. Diagnosed with schizophrenia while in college. Had multiple hospitalizations and medication trials. Did not tolerate Haldol or Depakote.  Family psychiatric history. Nonreported.  Principal Problem: Schizophrenia, paranoid (HCC) Diagnosis:   Patient Active Problem List   Diagnosis Date Noted  . Schizophrenia (HCC) [F20.9] 09/12/2017  . Cocaine use disorder, moderate, dependence (HCC) [F14.20] 09/12/2017  . Noncompliance [Z91.19] 09/12/2017  . Schizophrenia, paranoid (HCC) [F20.0] 09/12/2017  . Tobacco use disorder [F17.200] 09/12/2017   Total Time spent with patient: 20 minutes  Past Medical History:  Past Medical History:  Diagnosis Date  . Anemia  History reviewed. No pertinent surgical history. Family History: History reviewed. No pertinent family history.  Social History:  History  Alcohol Use  . Yes     History  Drug Use  . Types: Other-see comments     Comment: molly per patient    Social History   Social History  . Marital status: Single    Spouse name: N/A  . Number of children: N/A  . Years of education: N/A   Social History Main Topics  . Smoking status: Current Every Day Smoker    Packs/day: 0.50    Years: 1.00    Types: Cigarettes    Start date: 09/13/2016  . Smokeless tobacco: Current User  . Alcohol use Yes  . Drug use: Yes    Types: Other-see comments     Comment: molly per patient  . Sexual activity: Not Asked   Other Topics Concern  . None   Social History Narrative  . None        Sleep: Fair, sleeping during the daytime  Appetite:  Fair  Current Medications: Current Facility-Administered Medications  Medication Dose Route Frequency Provider Last Rate Last Dose  . acetaminophen (TYLENOL) tablet 650 mg  650 mg Oral Q6H PRN Clapacs, John T, MD      . alum & mag hydroxide-simeth (MAALOX/MYLANTA) 200-200-20 MG/5ML suspension 30 mL  30 mL Oral Q4H PRN Clapacs, John T, MD      . fluPHENAZine (PROLIXIN) tablet 10 mg  10 mg Oral BID AC & HS Pucilowska, Jolanta B, MD   10 mg at 09/18/17 0835  . lithium carbonate (ESKALITH) CR tablet 450 mg  450 mg Oral BID AC & HS Pucilowska, Jolanta B, MD   450 mg at 09/18/17 0835  . magnesium hydroxide (MILK OF MAGNESIA) suspension 30 mL  30 mL Oral Daily PRN Clapacs, John T, MD      . nicotine (NICODERM CQ - dosed in mg/24 hours) patch 21 mg  21 mg Transdermal Daily Pucilowska, Jolanta B, MD      . OLANZapine (ZYPREXA) tablet 30 mg  30 mg Oral QHS Pucilowska, Jolanta B, MD   30 mg at 09/17/17 2059  . temazepam (RESTORIL) capsule 15 mg  15 mg Oral QHS PRN Pucilowska, Jolanta B, MD   15 mg at 09/17/17 2101    Lab Results:  Results for orders placed or performed during the hospital encounter of 09/12/17 (from the past 48 hour(s))  Lithium level     Status: None   Collection Time: 09/16/17  4:19 PM  Result Value Ref Range   Lithium Lvl 0.60 0.60 - 1.20 mmol/L    Blood  Alcohol level:  Lab Results  Component Value Date   ETH <10 09/11/2017    Metabolic Disorder Labs: Lab Results  Component Value Date   HGBA1C 5.1 09/13/2017   MPG 99.67 09/13/2017   No results found for: PROLACTIN Lab Results  Component Value Date   CHOL 205 (H) 09/13/2017   TRIG 198 (H) 09/13/2017   HDL 30 (L) 09/13/2017   CHOLHDL 6.8 09/13/2017   VLDL 40 09/13/2017   LDLCALC 135 (H) 09/13/2017     Musculoskeletal: Strength & Muscle Tone: within normal limits Gait & Station: normal Patient leans: N/A  Psychiatric Specialty Exam: Physical Exam  Nursing note and vitals reviewed. Psychiatric: Her speech is normal. Her affect is blunt. She is withdrawn. Cognition and memory are normal. She exhibits a depressed mood.    Review of Systems  Constitutional: Negative.  HENT: Negative.   Eyes: Negative.   Respiratory: Negative.   Cardiovascular: Negative.   Gastrointestinal: Negative.   Genitourinary: Negative.   Musculoskeletal: Negative.   Skin: Negative.   Neurological: Negative.   Endo/Heme/Allergies: Negative.   Psychiatric/Behavioral: Positive for depression.    Blood pressure (!) 105/57, pulse 78, temperature 98.6 F (37 C), temperature source Oral, resp. rate 18, height  (1.651 m), weight 95.3 kg (210 lb), SpO2 100 %.Body mass index is 34.95 kg/m.  General Appearance: Fairly Groomed  Eye Contact:  Fair  Speech:  Clear and Coherent and Slow  Volume:  Decreased  Mood:  Depressed  Affect:  Blunt  Thought Process:  Goal Directed and Descriptions of Associations: Intact  Orientation:  Full (Time, Place, and Person)  Thought Content:  She is denying any hallucinations and paranoid thoughts have decreased  Suicidal Thoughts:  No  Homicidal Thoughts:  No  Memory:  Immediate;   Fair Recent;   Fair Remote;   Fair  Judgement:  Poor  Insight:  Lacking  Psychomotor Activity:  Psychomotor Retardation  Concentration:  Concentration: Poor and Attention Span:  Poor  Recall:  Poor  Fund of Knowledge:  Fair  Language:  Fair  Akathisia:  No  Handed:  Right  AIMS (if indicated):     Assets:  Communication Skills Desire for Improvement Financial Resources/Insurance Housing Physical Health Resilience Social Support  ADL's:  Intact  Cognition:  WNL  Sleep:  Number of Hours: 7.5     Treatment Plan Summary: Daily contact with patient to assess and evaluate symptoms and progress in treatment and Medication management   Ms. Tomasso is a 26 year old female with a history of schizophrenia admitted for psychotic break in the context of treatment discontinuation for several days.  1. Psychosis. She has been maintained on a combination of Zyprexa, Abilify, and low dose Latuda in the community but her voices were still not controlled. We gave Zyprexa 30 mg nightly and Prolixin 10 mg bid for psychosis. We restarted lithium 450 bid for mood stabilization. Lithium level 0.6 on 09/16/2017.  HgA1c was 5.1. Total cholesterol was 205  2. Polysubstance Use: The patient admits to using oxycontin, percocet, Molly and Xanax prior to admission. The patient was advised to abstain from all illicit drugs as they may worsen mood symptoms. She is not interested in any substance abuse treatment.  2. Insomnia. We will offer Restoril as prn.  3. Smoking. Nicotine patch is available.  4. Metabolic syndrome monitoring. Lipid panel, TSH, and hemoglobin A1c are normal.   5. EKG. Sinus rhythm, QTc 418.   6. Disposition. The patient does not want to return to her group home where she feels mistreated but the guardian recommends that she does until he finds new placement. She will follow up with Dr. Janeece Riggers at [facility]   Levora Angel, MD 09/18/2017, 3:00 PM

## 2017-09-18 NOTE — [facility] Group Notes (Signed)
[facility] LCSW Group Therapy 09/18/2017 1:15pm  Type of Therapy: Group Therapy- Feelings Around Discharge & Establishing a Supportive Framework  Participation Level:  Did Not Attend  Description of Group:   What is a supportive framework? What does it look like feel like and how do I discern it from and unhealthy non-supportive network? Learn how to cope when supports are not helpful and don't support you. Discuss what to do when your family/friends are not supportive.   Therapeutic Modalities:   Cognitive Behavioral Therapy Person-Centered Therapy Motivational Interviewing   Chelby Salata C Shiana Rappleye, LCSW 09/18/2017 12:57 PM     

## 2017-09-18 NOTE — [facility] Group Notes (Signed)
[facility] Group Notes:  (Nursing/MHT/Case Management/Adjunct)  Date:  09/18/2017  Time:  9:29 PM  Type of Therapy:  Group Therapy  Participation Level:  Minimal  Participation Quality:  Appropriate  Affect:  Appropriate  Cognitive:  Appropriate  Insight:  Good  Engagement in Group:  Engaged  Modes of Intervention:  Support  Summary of Progress/Problems:  Calvina Liptak A Anitta Tenny 09/18/2017, 9:29 PM

## 2017-09-18 NOTE — Plan of Care (Signed)
Problem: Coping: Goal: Ability to cope will improve Outcome: Progressing Laura Briggs stated the voices left this afternoon. Goal: Ability to verbalize feelings will improve Outcome: Progressing Laura Briggs was positive the voices would not continue throughout the day.

## 2017-09-18 NOTE — [facility] Group Notes (Signed)
[facility] Group Notes:  (Nursing/MHT/Case Management/Adjunct)  Date:  09/18/2017  Time:  4:59 AM  Type of Therapy:  Psychoeducational Skills  Participation Level:  Did Not Attend  Summary of Progress/Problems:  Chancy Milroy 09/18/2017, 4:59 AM

## 2017-09-18 NOTE — Plan of Care (Signed)
Problem: Education: Goal: Will be free of psychotic symptoms Outcome: Progressing Patient was observed up and out of room, dressed in street clothes, and engaging with peers this evening.  She was also on the phone with family at times.  She attended wrap up meeting and had snack in the dayroom.  She reports that she is "feeling a lot better and the medication is helping".    Problem: Safety: Goal: Ability to remain free from injury will improve Outcome: Progressing Patient denies SI/HI/AV/H and contracts for safety. She reports "hearing no voices" this night.

## 2017-09-18 NOTE — Progress Notes (Signed)
Received Trina this am after breakfast, her affect is flat and evasive. She stated the voices are back. She was compliant with her medications and returned to bed. After her nap this am she returned to the nurses station and stated the voices are gone. She was noted briefly in the milieu on the phone. This PM she was OOB for longer periods of time and making more telephone calls.

## 2017-09-19 NOTE — Plan of Care (Signed)
Problem: Coping: Goal: Ability to cope will improve Outcome: Progressing Pt will continue to use coping skills the entire shift.

## 2017-09-19 NOTE — Progress Notes (Signed)
Received Laura Briggs this am in her room asleep, she did not response to verbal stimuli and missed her breakfast. She woke up at 12noon, ate and received her morning medications. She stated she did not go to sleep until 0200 hrs. She was OOB in the milieu at briefs  intervals and talking on the phone. She stated the voices are not present today.

## 2017-09-19 NOTE — Tx Team (Signed)
Interdisciplinary Treatment and Diagnostic Plan Update  09/19/2017 Time of Session: 1110am Laura Briggs MRN: [id number]  Principal Diagnosis: Schizophrenia, paranoid (HCC)  Secondary Diagnoses: Principal Problem:   Schizophrenia, paranoid (HCC) Active Problems:   Cocaine use disorder, moderate, dependence (HCC)   Noncompliance   Tobacco use disorder   Current Medications:  Current Facility-Administered Medications  Medication Dose Route Frequency Provider Last Rate Last Dose  . acetaminophen (TYLENOL) tablet 650 mg  650 mg Oral Q6H PRN Clapacs, John T, MD      . alum & mag hydroxide-simeth (MAALOX/MYLANTA) 200-200-20 MG/5ML suspension 30 mL  30 mL Oral Q4H PRN Clapacs, John T, MD      . fluPHENAZine (PROLIXIN) tablet 10 mg  10 mg Oral BID AC & HS Pucilowska, Jolanta B, MD   10 mg at 09/19/17 1207  . lithium carbonate (ESKALITH) CR tablet 450 mg  450 mg Oral BID AC & HS Pucilowska, Jolanta B, MD   450 mg at 09/19/17 1206  . magnesium hydroxide (MILK OF MAGNESIA) suspension 30 mL  30 mL Oral Daily PRN Clapacs, John T, MD      . nicotine (NICODERM CQ - dosed in mg/24 hours) patch 21 mg  21 mg Transdermal Daily Pucilowska, Jolanta B, MD      . nicotine polacrilex (NICORETTE) gum 2 mg  2 mg Oral PRN Darliss Ridgel, MD   2 mg at 09/18/17 2022  . OLANZapine (ZYPREXA) tablet 30 mg  30 mg Oral QHS Pucilowska, Jolanta B, MD   30 mg at 09/18/17 2106  . temazepam (RESTORIL) capsule 15 mg  15 mg Oral QHS PRN Pucilowska, Jolanta B, MD   15 mg at 09/18/17 2106   PTA Medications: No prescriptions prior to admission.    Patient Stressors: Medication change or noncompliance  Patient Strengths: Ability for insight  Treatment Modalities: Medication Management, Group therapy, Case management,  1 to 1 session with clinician, Psychoeducation, Recreational therapy.   Physician Treatment Plan for Primary Diagnosis: Schizophrenia, paranoid (HCC) Long Term Goal(s): Improvement in symptoms so as  ready for discharge NA   Short Term Goals: Ability to identify changes in lifestyle to reduce recurrence of condition will improve Ability to verbalize feelings will improve Ability to disclose and discuss suicidal ideas Ability to demonstrate self-control will improve Ability to identify and develop effective coping behaviors will improve Ability to maintain clinical measurements within normal limits will improve Compliance with prescribed medications will improve Ability to identify triggers associated with substance abuse/mental health issues will improve NA  Medication Management: Evaluate patient's response, side effects, and tolerance of medication regimen.  Therapeutic Interventions: 1 to 1 sessions, Unit Group sessions and Medication administration.  Evaluation of Outcomes: Progressing  Physician Treatment Plan for Secondary Diagnosis: Principal Problem:   Schizophrenia, paranoid (HCC) Active Problems:   Cocaine use disorder, moderate, dependence (HCC)   Noncompliance   Tobacco use disorder  Long Term Goal(s): Improvement in symptoms so as ready for discharge NA   Short Term Goals: Ability to identify changes in lifestyle to reduce recurrence of condition will improve Ability to verbalize feelings will improve Ability to disclose and discuss suicidal ideas Ability to demonstrate self-control will improve Ability to identify and develop effective coping behaviors will improve Ability to maintain clinical measurements within normal limits will improve Compliance with prescribed medications will improve Ability to identify triggers associated with substance abuse/mental health issues will improve NA     Medication Management: Evaluate patient's response, side effects, and tolerance of medication  regimen.  Therapeutic Interventions: 1 to 1 sessions, Unit Group sessions and Medication administration.  Evaluation of Outcomes: Progressing   RN Treatment Plan for Primary  Diagnosis: Schizophrenia, paranoid (HCC) Long Term Goal(s): Knowledge of disease and therapeutic regimen to maintain health will improve  Short Term Goals: Ability to verbalize feelings will improve, Ability to identify and develop effective coping behaviors will improve and Compliance with prescribed medications will improve  Medication Management: RN will administer medications as ordered by provider, will assess and evaluate patient's response and provide education to patient for prescribed medication. RN will report any adverse and/or side effects to prescribing provider.  Therapeutic Interventions: 1 on 1 counseling sessions, Psychoeducation, Medication administration, Evaluate responses to treatment, Monitor vital signs and CBGs as ordered, Perform/monitor CIWA, COWS, AIMS and Fall Risk screenings as ordered, Perform wound care treatments as ordered.  Evaluation of Outcomes: Progressing   LCSW Treatment Plan for Primary Diagnosis: Schizophrenia, paranoid (HCC) Long Term Goal(s): Safe transition to appropriate next level of care at discharge, Engage patient in therapeutic group addressing interpersonal concerns.  Short Term Goals: Engage patient in aftercare planning with referrals and resources, Increase ability to appropriately verbalize feelings, Facilitate patient progression through stages of change regarding substance use diagnoses and concerns, Identify triggers associated with mental health/substance abuse issues and Increase skills for wellness and recovery  Therapeutic Interventions: Assess for all discharge needs, 1 to 1 time with Social worker, Explore available resources and support systems, Assess for adequacy in community support network, Educate family and significant other(s) on suicide prevention, Complete Psychosocial Assessment, Interpersonal group therapy.  Evaluation of Outcomes: Progressing   Progress in Treatment: Attending groups: Yes. Participating in groups:  Yes. Taking medication as prescribed: Yes. Toleration medication: Yes. Family/Significant other contact made: Yes, individual(s) contacted:  father, guardian Patient understands diagnosis: Yes. Discussing patient identified problems/goals with staff: Yes. Medical problems stabilized or resolved: Yes. Denies suicidal/homicidal ideation: Yes. Issues/concerns per patient self-inventory: Yes. Other:    New problem(s) identified: No, Describe:     New Short Term/Long Term Goal(s): to feel less depressed   Discharge Plan or Barriers: Pt will return to group home, [facility] and follow up with [facility]  Reason for Continuation of Hospitalization: Delusions  Depression Hallucinations Medication stabilization  Estimated Length of Stay: 3-5 days  Attendees: Patient: 09/19/2017   Physician: Dr. Jennet Maduro, MD 09/19/2017   Nursing: Marjo Bicker, RN 09/19/2017   RN Care Manager: 09/19/2017   Social Worker: Daleen Squibb and Jake Shark, LCSWs 09/19/2017   Recreational Therapist:  09/19/2017   Other:  09/19/2017   Other:  09/19/2017   Other: 09/19/2017        Scribe for Treatment Team: Lorri Frederick, LCSW 09/19/2017 3:13 PM

## 2017-09-19 NOTE — Progress Notes (Signed)
[facility] MD Progress Note  09/19/2017 3:21 PM Laura Briggs  MRN:  [id number]  Subjective:  Laura Briggs is a 26 year old female with a history of schizophrenia admitted for psychotic break in the context of treatment discontinuation.  09/19/2017. Today the patient again is in bed in her room with severe psychomotor retardation. She denies auditory hallucinations or symptoms of depression. Yesterday, per nursing report she was much more active, out of her room and interacting with peers. She however complaining of auditory hallucinations. Apparently her hallucinations come and go. The patient or the family are not interested in medication adjustment. She seems to tolerate her current regimen well. There are no somatic complaints. There is minimal interaction with peers or staff and eventually no group participation.  Treatment plan. We will continue Zyprexa 30 mg nightly and Prolixin 10 mg twice daily for psychosis and lithium 100 mg twice daily for mood stabilization. The patient is interested in injections of Abilify remained. Lithium level 0.6 on 10/5.  Social/disposition. The patient is disabled from mental illness and lives in a group home. She is in incompetent and in her father is the guardian. The patient dislikes her current group home but isn't allowed to return there. She complains that they do not pay for her allowance. Apparently the guardian is looking into new living arrangements.  Past psychiatric history. The patient was diagnosed with schizophrenia while in college. She had multiple psychiatric hospitalizations and medication trials. The family refuses Clozaril due to unacceptable risk.  Family psychiatric history. None reported.  Principal Problem: Schizophrenia, paranoid (HCC) Diagnosis:   Patient Active Problem List   Diagnosis Date Noted  . Schizophrenia (HCC) [F20.9] 09/12/2017  . Cocaine use disorder, moderate, dependence (HCC) [F14.20] 09/12/2017  . Noncompliance  [Z91.19] 09/12/2017  . Schizophrenia, paranoid (HCC) [F20.0] 09/12/2017  . Tobacco use disorder [F17.200] 09/12/2017   Total Time spent with patient: 20 minutes  Past Medical History:  Past Medical History:  Diagnosis Date  . Anemia    History reviewed. No pertinent surgical history. Family History: History reviewed. No pertinent family history.  Social History:  History  Alcohol Use  . Yes     History  Drug Use  . Types: Other-see comments    Comment: molly per patient    Social History   Social History  . Marital status: Single    Spouse name: N/A  . Number of children: N/A  . Years of education: N/A   Social History Main Topics  . Smoking status: Current Every Day Smoker    Packs/day: 0.50    Years: 1.00    Types: Cigarettes    Start date: 09/13/2016  . Smokeless tobacco: Current User  . Alcohol use Yes  . Drug use: Yes    Types: Other-see comments     Comment: molly per patient  . Sexual activity: Not Asked   Other Topics Concern  . None   Social History Narrative  . None        Sleep: Fair  Appetite:  Fair  Current Medications: Current Facility-Administered Medications  Medication Dose Route Frequency Provider Last Rate Last Dose  . acetaminophen (TYLENOL) tablet 650 mg  650 mg Oral Q6H PRN Clapacs, John T, MD      . alum & mag hydroxide-simeth (MAALOX/MYLANTA) 200-200-20 MG/5ML suspension 30 mL  30 mL Oral Q4H PRN Clapacs, John T, MD      . fluPHENAZine (PROLIXIN) tablet 10 mg  10 mg Oral BID AC & HS Pucilowska, Jolanta B,  MD   10 mg at 09/19/17 1207  . lithium carbonate (ESKALITH) CR tablet 450 mg  450 mg Oral BID AC & HS Pucilowska, Jolanta B, MD   450 mg at 09/19/17 1206  . magnesium hydroxide (MILK OF MAGNESIA) suspension 30 mL  30 mL Oral Daily PRN Clapacs, John T, MD      . nicotine (NICODERM CQ - dosed in mg/24 hours) patch 21 mg  21 mg Transdermal Daily Pucilowska, Jolanta B, MD      . nicotine polacrilex (NICORETTE) gum 2 mg  2 mg Oral  PRN Darliss Ridgel, MD   2 mg at 09/18/17 2022  . OLANZapine (ZYPREXA) tablet 30 mg  30 mg Oral QHS Pucilowska, Jolanta B, MD   30 mg at 09/18/17 2106  . temazepam (RESTORIL) capsule 15 mg  15 mg Oral QHS PRN Pucilowska, Jolanta B, MD   15 mg at 09/18/17 2106    Lab Results:  No results found for this or any previous visit (from the past 48 hour(s)).  Blood Alcohol level:  Lab Results  Component Value Date   ETH <10 09/11/2017    Metabolic Disorder Labs: Lab Results  Component Value Date   HGBA1C 5.1 09/13/2017   MPG 99.67 09/13/2017   No results found for: PROLACTIN Lab Results  Component Value Date   CHOL 205 (H) 09/13/2017   TRIG 198 (H) 09/13/2017   HDL 30 (L) 09/13/2017   CHOLHDL 6.8 09/13/2017   VLDL 40 09/13/2017   LDLCALC 135 (H) 09/13/2017     Musculoskeletal: Strength & Muscle Tone: within normal limits Gait & Station: normal Patient leans: N/A  Psychiatric Specialty Exam: Physical Exam  Nursing note and vitals reviewed. Psychiatric: Her speech is normal. Thought content normal. Her affect is blunt. She is withdrawn and actively hallucinating. Cognition and memory are normal. She expresses impulsivity. She exhibits a depressed mood.    Review of Systems  Neurological: Negative.   Psychiatric/Behavioral: Positive for depression.  All other systems reviewed and are negative.   Blood pressure (!) 105/57, pulse 78, temperature 98.6 F (37 C), temperature source Oral, resp. rate 18, height  (1.651 m), weight 95.3 kg (210 lb), SpO2 100 %.Body mass index is 34.95 kg/m.  General Appearance: Fairly Groomed  Eye Contact:  Good  Speech:  Clear and Coherent  Volume:  Decreased  Mood:  Depressed  Affect:  Blunt  Thought Process:  Goal Directed and Descriptions of Associations: Intact  Orientation:  Full (Time, Place, and Person)  Thought Content: No hallucinations today.  Suicidal Thoughts:  No  Homicidal Thoughts:  No  Memory:  Immediate;    Fair Recent;   Fair Remote;   Fair  Judgement:  Poor  Insight:  Lacking  Psychomotor Activity:  Psychomotor Retardation  Concentration:  Concentration: Poor and Attention Span: Poor  Recall:  Poor  Fund of Knowledge:  Fair  Language:  Fair  Akathisia:  No  Handed:  Right  AIMS (if indicated):     Assets:  Communication Skills Desire for Improvement Financial Resources/Insurance Housing Physical Health Resilience Social Support  ADL's:  Intact  Cognition:  WNL  Sleep:  Number of Hours: 7     Treatment Plan Summary: Daily contact with patient to assess and evaluate symptoms and progress in treatment and Medication management   Laura Briggs is a 26 year old female with a history of schizophrenia admitted for psychotic break in the context of treatment discontinuation for several days.  1. Psychosis. She  has been maintained on a combination of Zyprexa, Abilify, and low dose Latuda in the community but her voices were still not controlled. We gave Zyprexa 30 mg nightly and Prolixin 10 mg bid for psychosis. We restarted lithium 450 bid for mood stabilization. Lithium level 0.6 on 09/16/2017.   2. Polysubstance Use: The patient admits to using oxycontin, percocet, Molly and Xanax prior to admission. The patient was advised to abstain from all illicit drugs as they may worsen mood symptoms. She is not interested in any substance abuse treatment.  2. Insomnia. We will offer Restoril as prn.  3. Smoking. Nicotine patch is available.  4. Metabolic syndrome monitoring. Lipid panel, TSH, and hemoglobin A1c are normal.   5. EKG. Sinus rhythm, QTc 418.   6. Disposition. The patient does not want to return to her group home where she feels mistreated but the guardian recommends that she does until he finds new placement. She will follow up with Dr. Janeece Riggers at [facility].   Kristine Linea, MD 09/19/2017, 3:21 PM

## 2017-09-19 NOTE — Plan of Care (Signed)
Problem: Education: Goal: Will be free of psychotic symptoms Outcome: Progressing Laura Briggs is alert and oriented x4.  Problem: Coping: Goal: Ability to cope will improve Outcome: Progressing Laura Briggs is OOB this afternoon and smiling at intervals.

## 2017-09-19 NOTE — [facility] Group Notes (Signed)
[facility] Group Notes:  (Nursing/MHT/Case Management/Adjunct)  Date:  09/19/2017  Time:  2:15 PM  Type of Therapy:  Psychoeducational Skills  Participation Level:  Did Not Attend  Ayeisha Lindenberger Travis Mahogani Holohan 09/19/2017, 2:15 PM 

## 2017-09-19 NOTE — Progress Notes (Signed)
Pt is pleasant. Pt has minimal interactions with staff and peers. Pt stays in room and sleeps all shift. Pt did not attend wrap up group. Pt denies SI, HI or A/V hallucinations. Pt is med compliant, no adverse affects noted. No s/s of distress noted. Contracted to safety. 15 min safety checks continues.

## 2017-09-20 NOTE — Plan of Care (Signed)
Problem: Activity: Goal: Will verbalize the importance of balancing activity with adequate rest periods Outcome: Not Progressing Patient with a flat affect and depressed mood today. She was in bed and reclusive to her room most of day. Patient engaged in conversation with Clinical research associate very minimal.  Problem: Education: Goal: Will be free of psychotic symptoms Outcome: Progressing Patient does not appear to be responding to internal/external stimuli Goal: Knowledge of the prescribed therapeutic regimen will improve Outcome: Progressing Patient compliant with medications and educated on medication  Problem: Coping: Goal: Ability to cope will improve Outcome: Not Progressing Patient remains flat affect and depressed Goal: Ability to verbalize feelings will improve Outcome: Not Progressing Patient does not attend group and interacts very minimal with staff  Problem: Safety: Goal: Ability to redirect hostility and anger into socially appropriate behaviors will improve Outcome: Progressing Patient has been calm and cooperative this shift. Goal: Ability to remain free from injury will improve Outcome: Progressing Patient denies SI and remains free of injury and safe on unit

## 2017-09-20 NOTE — Progress Notes (Signed)
[facility] MD Progress Note  09/20/2017 8:08 PM Laura Briggs  MRN:  [id number]  Subjective: Laura Briggs is a 26 year old female with a history of schizophrenia admitted for psychotic break and suicidal ideation in the context of treatment noncompliance.   Today the patient is out of her room, in the community, groomed and interactive with peers and staff. She is no longer depressed or suicidal. She has some hallucinations but no commanding voices. She tolerates medications well. There are no somatic complaints, started going to groups. She told me today that she did try Clozapine in the past and that she developed involuntary movements. It seems unlikely. Her father does not believe she has ever been tried. Moreover, he and his wife oppose it.  Treatment plan. We will continue Zyprexa 30 mg and Prolixin 10 mg bid for psychosis and Lithium 450 mg bid for mood stabilization. Li level 0.6. Restoril is availbale for sleep.  Past psychiatric history. Schizophrenia with multiple medication trials and hospitalizations including [facility].  Family psychiatric history. None reported.  Social/disposition. She is an incompetent adult. Her father is her guardian. She is disabled from mental illness. She lives in a group home where she will return. She works with Dr. Janeece Riggers at [facility].   Principal Problem: Schizophrenia, paranoid (HCC) Diagnosis:   Patient Active Problem List   Diagnosis Date Noted  . Schizophrenia (HCC) [F20.9] 09/12/2017  . Cocaine use disorder, moderate, dependence (HCC) [F14.20] 09/12/2017  . Noncompliance [Z91.19] 09/12/2017  . Schizophrenia, paranoid (HCC) [F20.0] 09/12/2017  . Tobacco use disorder [F17.200] 09/12/2017   Total Time spent with patient: 30 minutes  Past Medical History:  Past Medical History:  Diagnosis Date  . Anemia    History reviewed. No pertinent surgical history. Family History: History reviewed. No pertinent family history.  Social History:  History  Alcohol Use   . Yes     History  Drug Use  . Types: Other-see comments    Comment: molly per patient    Social History   Social History  . Marital status: Single    Spouse name: N/A  . Number of children: N/A  . Years of education: N/A   Social History Main Topics  . Smoking status: Current Every Day Smoker    Packs/day: 0.50    Years: 1.00    Types: Cigarettes    Start date: 09/13/2016  . Smokeless tobacco: Current User  . Alcohol use Yes  . Drug use: Yes    Types: Other-see comments     Comment: molly per patient  . Sexual activity: Not Asked   Other Topics Concern  . None   Social History Narrative  . None   Additional Social History:                         Sleep: Fair  Appetite:  Fair  Current Medications: Current Facility-Administered Medications  Medication Dose Route Frequency Provider Last Rate Last Dose  . acetaminophen (TYLENOL) tablet 650 mg  650 mg Oral Q6H PRN Clapacs, John T, MD      . alum & mag hydroxide-simeth (MAALOX/MYLANTA) 200-200-20 MG/5ML suspension 30 mL  30 mL Oral Q4H PRN Clapacs, John T, MD      . fluPHENAZine (PROLIXIN) tablet 10 mg  10 mg Oral BID AC & HS Atanacio Melnyk B, MD   10 mg at 09/20/17 0900  . lithium carbonate (ESKALITH) CR tablet 450 mg  450 mg Oral BID AC & HS Eulanda Dorion  B, MD   450 mg at 09/20/17 0900  . magnesium hydroxide (MILK OF MAGNESIA) suspension 30 mL  30 mL Oral Daily PRN Clapacs, John T, MD      . nicotine (NICODERM CQ - dosed in mg/24 hours) patch 21 mg  21 mg Transdermal Daily Shahram Alexopoulos B, MD      . nicotine polacrilex (NICORETTE) gum 2 mg  2 mg Oral PRN Darliss Ridgel, MD   2 mg at 09/18/17 2022  . OLANZapine (ZYPREXA) tablet 30 mg  30 mg Oral QHS Loran Fleet B, MD   30 mg at 09/19/17 2138  . temazepam (RESTORIL) capsule 15 mg  15 mg Oral QHS PRN Keiley Levey B, MD   15 mg at 09/18/17 2106    Lab Results: No results found for this or any previous visit (from the past 48  hour(s)).  Blood Alcohol level:  Lab Results  Component Value Date   ETH <10 09/11/2017    Metabolic Disorder Labs: Lab Results  Component Value Date   HGBA1C 5.1 09/13/2017   MPG 99.67 09/13/2017   No results found for: PROLACTIN Lab Results  Component Value Date   CHOL 205 (H) 09/13/2017   TRIG 198 (H) 09/13/2017   HDL 30 (L) 09/13/2017   CHOLHDL 6.8 09/13/2017   VLDL 40 09/13/2017   LDLCALC 135 (H) 09/13/2017    Physical Findings: AIMS:  , ,  ,  ,    CIWA:    COWS:     Musculoskeletal: Strength & Muscle Tone: within normal limits Gait & Station: normal Patient leans: N/A  Psychiatric Specialty Exam: Physical Exam  Nursing note and vitals reviewed. Psychiatric: She has a normal mood and affect. Her speech is normal and behavior is normal. Thought content normal. Cognition and memory are normal. She expresses impulsivity.    Review of Systems  Neurological: Negative.   Psychiatric/Behavioral: Positive for hallucinations.  All other systems reviewed and are negative.   Blood pressure (!) 105/57, pulse 78, temperature 98.6 F (37 C), temperature source Oral, resp. rate 18, height  (1.651 m), weight 95.3 kg (210 lb), SpO2 100 %.Body mass index is 34.95 kg/m.  General Appearance: Casual  Eye Contact:  Good  Speech:  Clear and Coherent  Volume:  Normal  Mood:  Euthymic  Affect:  Appropriate  Thought Process:  Goal Directed and Descriptions of Associations: Intact  Orientation:  Full (Time, Place, and Person)  Thought Content:  Hallucinations: Auditory  Suicidal Thoughts:  No  Homicidal Thoughts:  No  Memory:  Immediate;   Fair Recent;   Fair Remote;   Fair  Judgement:  Impaired  Insight:  Shallow  Psychomotor Activity:  Normal  Concentration:  Concentration: Fair and Attention Span: Fair  Recall:  [employer] of Knowledge:  Fair  Language:  Fair  Akathisia:  No  Handed:  Right  AIMS (if indicated):     Assets:  Communication Skills Desire for  Improvement Financial Resources/Insurance Housing Physical Health Resilience Social Support  ADL's:  Intact  Cognition:  WNL  Sleep:  Number of Hours: 7.15     Treatment Plan Summary: Daily contact with patient to assess and evaluate symptoms and progress in treatment and Medication management   Ms. Dunsmore is a 26 year old female with a history of schizophrenia admitted for psychotic break in the context of treatment discontinuation for several days.  1. Psychosis. She has been maintained on a combination of Zyprexa, Abilify, and low dose Laura Briggs  in the community but her voices were still not controlled. We gave Zyprexa 30 mg nightly and Prolixin 10 mg bid for psychosis. We restarted lithium 450 bid for mood stabilization. Lithium level 0.6 on 09/16/2017.   2. Polysubstance Use: The patient admits to using oxycontin, percocet, Molly and Xanax prior to admission. The patient was advised to abstain from all illicit drugs as they may worsen mood symptoms. She is not interested in any substance abuse treatment.  2. Insomnia. We will offer Restoril as prn.  3. Smoking. Nicotine patch is available.  4. Metabolic syndrome monitoring. Lipid panel, TSH, and hemoglobin A1c are normal.   5. EKG. Sinus rhythm, QTc 418.   6. Disposition. The patient does not want to return to her group home where she feels mistreated but the guardian recommends that she does until he finds new placement. She will follow up with Dr. Janeece Riggers at [facility].  Kristine Linea, MD 09/20/2017, 8:08 PM

## 2017-09-20 NOTE — [facility] Group Notes (Signed)
LCSW Group Therapy Note  09/20/2017 3pm  Type of Therapy/Topic:  Group Therapy:  Feelings about Diagnosis  Participation Level:  Minimal   Description of Group:   This group will allow patients to explore their thoughts and feelings about diagnoses they have received. Patients will be guided to explore their level of understanding and acceptance of these diagnoses. Facilitator will encourage patients to process their thoughts and feelings about the reactions of others to their diagnosis and will guide patients in identifying ways to discuss their diagnosis with significant others in their lives. This group will be process-oriented, with patients participating in exploration of their own experiences, giving and receiving support, and processing challenge from other group members.   Therapeutic Goals: 1. Patient will demonstrate understanding of diagnosis as evidenced by identifying two or more symptoms of the disorder 2. Patient will be able to express two feelings regarding the diagnosis 3. Patient will demonstrate their ability to communicate their needs through discussion and/or role play  Summary of Patient Progress:   Pt able to participate in group discussion.  Able to meet therapeutic goals listed above.  She shares about her symptoms of not sleeping well and eating less.  She expresses frustration and seems to have some insight that for her smoking mariajuana and using other drugs always leads to things that she regrets.    Therapeutic Modalities:   Cognitive Behavioral Therapy Brief Therapy Feelings Identification    Glennon Mac, LCSW 09/20/2017 3:53 PM

## 2017-09-21 MED ORDER — OLANZAPINE 15 MG PO TABS: 30.0000 mg | ORAL_TABLET | Freq: Every day | ORAL | 1 refills | Status: DC

## 2017-09-21 MED ORDER — FLUPHENAZINE HCL 10 MG PO TABS: 10.0000 mg | ORAL_TABLET | Freq: Two times a day (BID) | ORAL | 1 refills | Status: DC

## 2017-09-21 MED ORDER — LITHIUM CARBONATE ER 450 MG PO TBCR: 450.0000 mg | EXTENDED_RELEASE_TABLET | Freq: Two times a day (BID) | ORAL | 1 refills | Status: DC

## 2017-09-21 MED ORDER — FLUPHENAZINE DECANOATE 25 MG/ML IJ SOLN
50.0000 mg | INTRAMUSCULAR | Status: DC
  Administered 2017-09-22: 50 mg via INTRAMUSCULAR
  Filled 2017-09-21 (×2): qty 2

## 2017-09-21 NOTE — [facility] Suicide Risk Assessment (Signed)
[facility] Discharge Suicide Risk Assessment   Principal Problem: Schizophrenia, paranoid [facility]) Discharge Diagnoses:  Patient Active Problem List   Diagnosis Date Noted  . Schizophrenia (HCC) [F20.9] 09/12/2017  . Cocaine use disorder, moderate, dependence (HCC) [F14.20] 09/12/2017  . Noncompliance [Z91.19] 09/12/2017  . Schizophrenia, paranoid (HCC) [F20.0] 09/12/2017  . Tobacco use disorder [F17.200] 09/12/2017    Total Time spent with patient: 30 minutes  Musculoskeletal: Strength & Muscle Tone: within normal limits Gait & Station: normal Patient leans: N/A  Psychiatric Specialty Exam: Review of Systems  Neurological: Negative.   Psychiatric/Behavioral: Positive for hallucinations.  All other systems reviewed and are negative.   Blood pressure (!) 105/57, pulse 78, temperature 98.6 F (37 C), temperature source Oral, resp. rate 18, height  (1.651 m), weight 95.3 kg (210 lb), SpO2 100 %.Body mass index is 34.95 kg/m.  General Appearance: Casual  Eye Contact::  Good  Speech:  Clear and Coherent409  Volume:  Normal  Mood:  Euthymic  Affect:  Appropriate  Thought Process:  Goal Directed and Descriptions of Associations: Intact  Orientation:  Full (Time, Place, and Person)  Thought Content:  Hallucinations: Auditory  Suicidal Thoughts:  No  Homicidal Thoughts:  No  Memory:  Immediate;   Fair Recent;   Fair Remote;   Fair  Judgement:  Impaired  Insight:  Shallow  Psychomotor Activity:  Normal  Concentration:  Fair  Recall:  [employer] of Knowledge:Fair  Language: Fair  Akathisia:  No  Handed:  Right  AIMS (if indicated):     Assets:  Communication Skills Desire for Improvement Financial Resources/Insurance Housing Physical Health Resilience Social Support  Sleep:  Number of Hours: 6.5  Cognition: WNL  ADL's:  Intact   Mental Status Per Nursing Assessment::   On Admission:  NA  Demographic Factors:  Adolescent or young adult  Loss  Factors: NA  Historical Factors: Prior suicide attempts, Family history of mental illness or substance abuse and Impulsivity  Risk Reduction Factors:   Sense of responsibility to family, Living with another person, especially a relative, Positive social support and Positive therapeutic relationship  Continued Clinical Symptoms:  Alcohol/Substance Abuse/Dependencies Schizophrenia:   Depressive state Less than 36 years old  Cognitive Features That Contribute To Risk:  None    Suicide Risk:  Minimal: No identifiable suicidal ideation.  Patients presenting with no risk factors but with morbid ruminations; may be classified as minimal risk based on the severity of the depressive symptoms  Follow-up Information    [facility] Go on 10/31/2017.   Why:  2:40pm, earliest available for Hospital Follow up. Contact information: [address] [place] [address] [phone]           Plan Of Care/Follow-up recommendations:  Activity:  as tolerated. Diet:  low sodium heart healthy. Other:  keep follow up appointments.  Kristine Linea, MD 09/21/2017, 10:16 AM

## 2017-09-21 NOTE — [facility] Group Notes (Signed)
[facility] Group Notes:  (Nursing/MHT/Case Management/Adjunct)  Date:  09/21/2017  Time:  10:14 AM  Type of Therapy:  Psychoeducational Skills  Participation Level:  Did Not Attend  Lynelle Smoke Louay Myrie 09/21/2017, 10:14 AM

## 2017-09-21 NOTE — Progress Notes (Signed)
D: Pt A & O X3. Denies SI, HI, AVH and pain. Visible in milieu at brief intervals during shift for meals. Observed asleep in bed for majority of this shift while waiting to be picked up for d/c by her father. Verbalized concerns related to follow up care, scheduled appointment in [place]. Rates her depression, hopelessness and anxiety all 0/10. A: Emotional support and availability provided to pt. Medications administered with verbal education and effects monitored. CSW informed of pt's concerns regarding follow up care. Per CSW issues have been discussed with pt's father, who is also her guardian.  Q 15 minutes safety checks continues without self harm gestures thus far. R: Pt receptive to care. Remains medication compliant. Denies adverse drug reactions or concerns at this time. Pt remains safe on unit.

## 2017-09-21 NOTE — [facility] Group Notes (Signed)
  [facility] LCSW Group Therapy Note  Date/Time: 09/21/17, 1300  Type of Therapy/Topic:  Group Therapy:  Emotion Regulation  Participation Level:  Did Not Attend   Mood:  Description of Group:    The purpose of this group is to assist patients in learning to regulate negative emotions and experience positive emotions. Patients will be guided to discuss ways in which they have been vulnerable to their negative emotions. These vulnerabilities will be juxtaposed with experiences of positive emotions or situations, and patients challenged to use positive emotions to combat negative ones. Special emphasis will be placed on coping with negative emotions in conflict situations, and patients will process healthy conflict resolution skills.  Therapeutic Goals: 1. Patient will identify two positive emotions or experiences to reflect on in order to balance out negative emotions:  2. Patient will label two or more emotions that they find the most difficult to experience:  3. Patient will be able to demonstrate positive conflict resolution skills through discussion or role plays:   Summary of Patient Progress:       Therapeutic Modalities:   Cognitive Behavioral Therapy Feelings Identification Dialectical Behavioral Therapy  Greg Rathana Viveros, LCSW 

## 2017-09-21 NOTE — [place] FL2 (Signed)
  [place] MEDICAID FL2 LEVEL OF CARE SCREENING TOOL     IDENTIFICATION  Patient Name: Laura Briggs Birthdate: 10-28-1991 Sex: female Admission Date (Current Location): 09/12/2017  [place] and [place] Number:  [facility] [id number] [facility] Facility and Address:  [facility], [address], [place], [place] [address]      Provider Number: [id number]  Attending Physician Name and Address:  Shari Prows, MD  Relative Name and Phone Number:  Guardian &Father Constanza Mincy (Father) [phone]       Current Level of Care: Hospital Recommended Level of Care: Other (Comment) (Group Home) Prior Approval Number:    Date Approved/Denied:   PASRR Number:    Discharge Plan: Other (Comment) (Group Home)    Current Diagnoses: Patient Active Problem List   Diagnosis Date Noted  . Schizophrenia (HCC) 09/12/2017  . Cocaine use disorder, moderate, dependence (HCC) 09/12/2017  . Noncompliance 09/12/2017  . Schizophrenia, paranoid (HCC) 09/12/2017  . Tobacco use disorder 09/12/2017    Orientation RESPIRATION BLADDER Height & Weight     Self, Time, Situation, Place  Normal Continent Weight: 210 lb (95.3 kg) Height:   (165.1 cm)  BEHAVIORAL SYMPTOMS/MOOD NEUROLOGICAL BOWEL NUTRITION STATUS      Continent    AMBULATORY STATUS COMMUNICATION OF NEEDS Skin   Independent Verbally Normal                       Personal Care Assistance Level of Assistance  Bathing, Feeding, Dressing Bathing Assistance: Independent Feeding assistance: Independent Dressing Assistance: Independent     Functional Limitations Info  Sight, Hearing, Speech Sight Info: Adequate Hearing Info: Adequate Speech Info: Adequate    SPECIAL CARE FACTORS FREQUENCY                       Contractures Contractures Info: Not present    Additional Factors Info                  Current Medications (09/21/2017):  This is the current hospital active medication  list TAKE these medications     Indication  fluPHENAZine 10 MG tablet Commonly known as:  PROLIXIN Take 1 tablet (10 mg total) by mouth 2 (two) times daily at 8 am and 10 pm.  Indication:  Psychosis   lithium carbonate 450 MG CR tablet Commonly known as:  ESKALITH Take 1 tablet (450 mg total) by mouth 2 (two) times daily at 8 am and 10 pm.  Indication:  Schizoaffective Disorder   OLANZapine 15 MG tablet Commonly known as:  ZYPREXA Take 2 tablets (30 mg total) by mouth at bedtime.  Indication:  Schizophrenia      Discharge Medications: Please see discharge summary for a list of discharge medications.  Relevant Imaging Results:  Relevant Lab Results:   Additional Information    Glennon Mac, LCSW

## 2017-09-21 NOTE — Progress Notes (Signed)
D: Pt denies SI/HI/AVH, affect is flat but brightens upon approach this evening, mood is less irritable. Pt is cooperative with treatment plan. Pt appears less anxious, and she is interacting with peers and staff appropriately.  A: Pt was offered support and encouragement. Pt was given scheduled medications. Pt was encouraged to attend groups. Q 15 minute checks were done for safety.  R:Pt attended group and was appropriate with peers and staff. Pt is compliant with medication. Pt is receptive to treatment on unit, safety maintained on unit.

## 2017-09-21 NOTE — Progress Notes (Signed)
[facility] MD Progress Note  09/21/2017 6:12 PM Kerri Kovacik  MRN:  [id number]  Subjective:  Mr. Cerros is a 26 year old female with a history of schizophrenia admitted for agitation and suicidal ideation in the context of treatment noncompliance.  Today the patient denies any symptoms of depression, anxiety or psychosis. She is not suicidal or homicidal. She accepts medications and tolerates it well. She is interested in taking Prolixin Decanoate injection which is supported by her father who is the guardian. There are no somatic complaints there are no side effects from medications. We were hoping to discharge this patient today with her parents who will then take her to a new group home. He is her father however was unable to come tonight. I spoke with him. He will not be able to pick her up until Saturday we therefore agreed that the patient will be discharged tomorrow to her old group home for the time being. Her guardian will make new arrangements over the weekend.  There is no change in treatment plan. There is no new information available except for what about. Anticipated discharge tomorrow.   Principal Problem: Schizophrenia, paranoid (HCC) Diagnosis:   Patient Active Problem List   Diagnosis Date Noted  . Schizophrenia (HCC) [F20.9] 09/12/2017  . Cocaine use disorder, moderate, dependence (HCC) [F14.20] 09/12/2017  . Noncompliance [Z91.19] 09/12/2017  . Schizophrenia, paranoid (HCC) [F20.0] 09/12/2017  . Tobacco use disorder [F17.200] 09/12/2017   Total Time spent with patient: 30 minutes  Past Psychiatric History: Schizophrenia.  Past Medical History:  Past Medical History:  Diagnosis Date  . Anemia    History reviewed. No pertinent surgical history. Family History: History reviewed. No pertinent family history. Family Psychiatric  History: None reported. Social History:  History  Alcohol Use  . Yes     History  Drug Use  . Types: Other-see comments    Comment:  molly per patient    Social History   Social History  . Marital status: Single    Spouse name: N/A  . Number of children: N/A  . Years of education: N/A   Social History Main Topics  . Smoking status: Current Every Day Smoker    Packs/day: 0.50    Years: 1.00    Types: Cigarettes    Start date: 09/13/2016  . Smokeless tobacco: Current User  . Alcohol use Yes  . Drug use: Yes    Types: Other-see comments     Comment: molly per patient  . Sexual activity: Not Asked   Other Topics Concern  . None   Social History Narrative  . None   Additional Social History:                         Sleep: Fair  Appetite:  Fair  Current Medications: Current Facility-Administered Medications  Medication Dose Route Frequency Provider Last Rate Last Dose  . acetaminophen (TYLENOL) tablet 650 mg  650 mg Oral Q6H PRN Clapacs, John T, MD      . alum & mag hydroxide-simeth (MAALOX/MYLANTA) 200-200-20 MG/5ML suspension 30 mL  30 mL Oral Q4H PRN Clapacs, John T, MD      . fluPHENAZine (PROLIXIN) tablet 10 mg  10 mg Oral BID AC & HS Veryl Abril B, MD   10 mg at 09/21/17 0839  . fluPHENAZine decanoate (PROLIXIN) injection 50 mg  50 mg Intramuscular Q14 Days Shakai Dolley B, MD      . lithium carbonate (ESKALITH) CR tablet 450 mg  450 mg Oral BID AC & HS Chyrl Elwell B, MD   450 mg at 09/21/17 0839  . magnesium hydroxide (MILK OF MAGNESIA) suspension 30 mL  30 mL Oral Daily PRN Clapacs, John T, MD      . nicotine (NICODERM CQ - dosed in mg/24 hours) patch 21 mg  21 mg Transdermal Daily Jadyn Brasher B, MD      . nicotine polacrilex (NICORETTE) gum 2 mg  2 mg Oral PRN Darliss Ridgel, MD   2 mg at 09/21/17 0841  . OLANZapine (ZYPREXA) tablet 30 mg  30 mg Oral QHS Lylah Lantis B, MD   30 mg at 09/20/17 2145  . temazepam (RESTORIL) capsule 15 mg  15 mg Oral QHS PRN Annett Boxwell B, MD   15 mg at 09/20/17 2145    Lab Results: No results found for this or  any previous visit (from the past 48 hour(s)).  Blood Alcohol level:  Lab Results  Component Value Date   ETH <10 09/11/2017    Metabolic Disorder Labs: Lab Results  Component Value Date   HGBA1C 5.1 09/13/2017   MPG 99.67 09/13/2017   No results found for: PROLACTIN Lab Results  Component Value Date   CHOL 205 (H) 09/13/2017   TRIG 198 (H) 09/13/2017   HDL 30 (L) 09/13/2017   CHOLHDL 6.8 09/13/2017   VLDL 40 09/13/2017   LDLCALC 135 (H) 09/13/2017    Physical Findings: AIMS:  , ,  ,  ,    CIWA:    COWS:     Musculoskeletal: Strength & Muscle Tone: within normal limits Gait & Station: normal Patient leans: N/A  Psychiatric Specialty Exam: Physical Exam  Nursing note and vitals reviewed. Psychiatric: She has a normal mood and affect. Her speech is normal and behavior is normal. Thought content normal. Cognition and memory are normal. She expresses impulsivity.    Review of Systems  Neurological: Negative.   Psychiatric/Behavioral: Positive for hallucinations.  All other systems reviewed and are negative.   Blood pressure (!) 105/57, pulse 78, temperature 98.6 F (37 C), temperature source Oral, resp. rate 18, height  (1.651 m), weight 95.3 kg (210 lb), SpO2 100 %.Body mass index is 34.95 kg/m.  General Appearance: Casual  Eye Contact:  Good  Speech:  Clear and Coherent  Volume:  Normal  Mood:  Euthymic  Affect:  Appropriate  Thought Process:  Goal Directed and Descriptions of Associations: Intact  Orientation:  Full (Time, Place, and Person)  Thought Content:  WDL  Suicidal Thoughts:  No  Homicidal Thoughts:  No  Memory:  Immediate;   Fair Recent;   Fair Remote;   Fair  Judgement:  Poor  Insight:  Lacking  Psychomotor Activity:  Normal  Concentration:  Concentration: Fair and Attention Span: Fair  Recall:  [employer] of Knowledge:  Fair  Language:  Fair  Akathisia:  No  Handed:  Right  AIMS (if indicated):     Assets:  Communication  Skills Desire for Improvement Financial Resources/Insurance Housing Physical Health Resilience Social Support  ADL's:  Intact  Cognition:  WNL  Sleep:  Number of Hours: 6.5     Treatment Plan Summary: Daily contact with patient to assess and evaluate symptoms and progress in treatment and Medication management   Ms. Younce is a 26 year old female with a history of schizophrenia admitted for psychotic break in the context of treatment discontinuation for several days.  1. Psychosis. She has been maintained on a  combination of Zyprexa, Abilify, and low dose Latuda in the community but her voices were still not controlled. We gave Zyprexa 30 mg nightly and Prolixin 10 mg bid for psychosis. Her guardian approved Prolixin Decanoate 50 mg every 2 weeks. First dose tonight. We restarted lithium 450 bid for mood stabilization. Lithium level 0.6 on 09/16/2017.   2. Polysubstance Use: The patient admits to using oxycontin, percocet, Molly and Xanax prior to admission. The patient was advised to abstain from all illicit drugs as they may worsen mood symptoms. She is not interested in any substance abuse treatment.  2. Insomnia. Resolved.  3. Smoking. Nicotine patch is available.  4. Metabolic syndrome monitoring. Lipid panel, TSH, and hemoglobin A1c are normal.   5. EKG. Sinus rhythm, QTc 418.   6. Social. She is an incompetent adult. Her father is the guardian.   7. Disposition. The patient will be discharged to her all group home tomorrow. Her guardian will make new arrangements in the future. She will follow up with Dr. Janeece Riggers at [facility].   Kristine Linea, MD 09/21/2017, 6:12 PM

## 2017-09-21 NOTE — Discharge Summary (Signed)
Physician Discharge Summary Note  Patient:  Laura Briggs is an 26 y.o., female MRN:  [id number] DOB:  1991-01-24 Patient phone:  [phone] (home)  Patient address:   [address] [place] [address],  Total Time spent with patient: 30 minutes  Date of Admission:  09/12/2017 Date of Discharge: 09/21/2017  Reason for Admission:  Psychotic break.  Identifying data. Laura Briggs is a 26 year old female with history of schizophrenia.  Chief complaint. "Do you think it is my voice."  History of present illness. Information was obtained from the patient and the chart. The patient came to the emergency room after she eloped from her group home for 2 days, staying with her friend, not taking medications, taking "Molly". She became acutely psychotic with loud demeaning auditory hallucinations and paranoia. She called her father and was brought to the emergency room. The patient has a long history of schizophrenia. She has been maintained on a combination of lithium, Abilify, Zyprexa, and Latuda in the care of Dr. Janeece Riggers. She has been working with Dr. Janeece Riggers for the past 6 months but her auditory hallucinations have never gone away. She has been dating her current group home for 6 months as well. She believes that they have always mistreated her and the situation has gotten worse recently forcing her to run away. She denies depressive symptoms or symptoms suggestive of bipolar mania. She has anxiety attacks sometimes twice a day depending on the strength and content of her voices. The voices never go away. At best she hears noises that she compares to football game crowd. She denies PTSD or OCD symptoms. While staying with a friend for 2 days she says she used marijuana and Molly.   Past psychiatric history. She was diagnosed with mental illness which she was in college at the age of 8. She has 3 prior hospitalization. She has been tried on multiple medications with no success. She has tremor from  Haldol and liver problems. Depakote. Her current regimen in LITHIUM and 3 second generation antipsychotics. She denies ever attempting suicide.  Family psychiatric history. Nonreported.  Social history. She is an incompetent adult. Her father is the guardian. She has been at her current group home for 6 months but does not want to return there. Reportedly her father has already identified another facility. She is disabled from mental illness.  Principal Problem: Schizophrenia, paranoid [facility]) Discharge Diagnoses: Patient Active Problem List   Diagnosis Date Noted  . Schizophrenia (HCC) [F20.9] 09/12/2017  . Cocaine use disorder, moderate, dependence (HCC) [F14.20] 09/12/2017  . Noncompliance [Z91.19] 09/12/2017  . Schizophrenia, paranoid (HCC) [F20.0] 09/12/2017  . Tobacco use disorder [F17.200] 09/12/2017    Past Medical History:  Past Medical History:  Diagnosis Date  . Anemia    History reviewed. No pertinent surgical history. Family History: History reviewed. No pertinent family history.  Social History:  History  Alcohol Use  . Yes     History  Drug Use  . Types: Other-see comments    Comment: molly per patient    Social History   Social History  . Marital status: Single    Spouse name: N/A  . Number of children: N/A  . Years of education: N/A   Social History Main Topics  . Smoking status: Current Every Day Smoker    Packs/day: 0.50    Years: 1.00    Types: Cigarettes    Start date: 09/13/2016  . Smokeless tobacco: Current User  . Alcohol use Yes  . Drug use:  Yes    Types: Other-see comments     Comment: molly per patient  . Sexual activity: Not Asked   Other Topics Concern  . None   Social History Narrative  . None    Hospital Course:    Laura Briggs is a 26 year old female with a history of schizophrenia admitted for psychotic break in the context of treatment discontinuation for several days.  1. Psychosis. She has been maintained on a  combination of Zyprexa, Abilify, and low dose Latuda in the community but her voices were still not controlled. We gave Zyprexa 30 mg nightly and Prolixin 10 mg bid for psychosis. We restarted lithium 450 bid for mood stabilization. Lithium level 0.6 on 09/16/2017.   2. Polysubstance Use: The patient admits to using oxycontin, percocet, Molly and Xanax prior to admission. The patient was advised to abstain from all illicit drugs as they may worsen mood symptoms. She is not interested in any substance abuse treatment.  2. Insomnia. Resolved.  3. Smoking. Nicotine patch is available.  4. Metabolic syndrome monitoring. Lipid panel, TSH, and hemoglobin A1c are normal.   5. EKG. Sinus rhythm, QTc 418.   6. Social. She is an incompetent adult. Her father is the guardian.   7. Disposition. The patient was discharged to her group home. She will follow up with Dr. Janeece Riggers at [facility].  Physical Findings: AIMS:  , ,  ,  ,    CIWA:    COWS:     Musculoskeletal: Strength & Muscle Tone: within normal limits Gait & Station: normal Patient leans: N/A  Psychiatric Specialty Exam: Physical Exam  Nursing note and vitals reviewed. Psychiatric: She has a normal mood and affect. Her speech is normal and behavior is normal. Thought content normal. She expresses impulsivity.    Review of Systems  Neurological: Negative.   Psychiatric/Behavioral: Positive for hallucinations and substance abuse.  All other systems reviewed and are negative.   Blood pressure (!) 105/57, pulse 78, temperature 98.6 F (37 C), temperature source Oral, resp. rate 18, height  (1.651 m), weight 95.3 kg (210 lb), SpO2 100 %.Body mass index is 34.95 kg/m.  General Appearance: Casual  Eye Contact:  Good  Speech:  Clear and Coherent  Volume:  Normal  Mood:  Euthymic  Affect:  Appropriate  Thought Process:  Goal Directed and Descriptions of Associations: Intact  Orientation:  Full (Time, Place, and Person)  Thought  Content:  Hallucinations: Auditory  Suicidal Thoughts:  No  Homicidal Thoughts:  No  Memory:  Immediate;   Fair Recent;   Fair Remote;   Fair  Judgement:  Impaired  Insight:  Shallow  Psychomotor Activity:  Normal  Concentration:  Concentration: Fair and Attention Span: Fair  Recall:  [employer] of Knowledge:  Fair  Language:  Fair  Akathisia:  No  Handed:  Right  AIMS (if indicated):     Assets:  Communication Skills Desire for Improvement Financial Resources/Insurance Housing Physical Health Resilience Social Support  ADL's:  Intact  Cognition:  WNL  Sleep:  Number of Hours: 6.5        Has this patient used any form of tobacco in the last 30 days? (Cigarettes, Smokeless Tobacco, Cigars, and/or Pipes) Yes, Yes, A prescription for an FDA-approved tobacco cessation medication was offered at discharge and the patient refused  Blood Alcohol level:  Lab Results  Component Value Date   [facility] <10 09/11/2017    Metabolic Disorder Labs:  Lab Results  Component Value  Date   HGBA1C 5.1 09/13/2017   MPG 99.67 09/13/2017   No results found for: PROLACTIN Lab Results  Component Value Date   CHOL 205 (H) 09/13/2017   TRIG 198 (H) 09/13/2017   HDL 30 (L) 09/13/2017   CHOLHDL 6.8 09/13/2017   VLDL 40 09/13/2017   LDLCALC 135 (H) 09/13/2017    See Psychiatric Specialty Exam and Suicide Risk Assessment completed by Attending Physician prior to discharge.  Discharge destination:  Home  Is patient on multiple antipsychotic therapies at discharge:  Yes,   Do you recommend tapering to monotherapy for antipsychotics?  No   Has Patient had three or more failed trials of antipsychotic monotherapy by history:  Yes,   Antipsychotic medications that previously failed include:   1.  haldol., 2.  seroquel. and 3.  latuda.  Recommended Plan for Multiple Antipsychotic Therapies: Additional reason(s) for multiple antispychotic treatment:  inadequate response to a single  agent.  Discharge Instructions    Diet - low sodium heart healthy    Complete by:  As directed    Increase activity slowly    Complete by:  As directed      Allergies as of 09/21/2017   No Known Allergies     Medication List    TAKE these medications     Indication  fluPHENAZine 10 MG tablet Commonly known as:  PROLIXIN Take 1 tablet (10 mg total) by mouth 2 (two) times daily at 8 am and 10 pm.  Indication:  Psychosis   lithium carbonate 450 MG CR tablet Commonly known as:  ESKALITH Take 1 tablet (450 mg total) by mouth 2 (two) times daily at 8 am and 10 pm.  Indication:  Schizoaffective Disorder   OLANZapine 15 MG tablet Commonly known as:  ZYPREXA Take 2 tablets (30 mg total) by mouth at bedtime.  Indication:  Schizophrenia      Follow-up Information    [facility] [place]. Go on 10/31/2017.   Why:  2:40pm, earliest available for Hospital Follow up. Contact information: [address] [place] [address] [phone]           Follow-up recommendations:  Activity:  as tolerated. Diet:  low sodium heart healthy. Other:  keep follow up appointments.  Comments:    Signed: Kristine Linea, MD 09/21/2017, 10:26 AM

## 2017-09-22 MED ORDER — FLUPHENAZINE DECANOATE 25 MG/ML IJ SOLN
50.0000 mg | INTRAMUSCULAR | Status: DC
  Filled 2017-09-22: qty 2

## 2017-09-22 MED ORDER — FLUPHENAZINE DECANOATE 25 MG/ML IJ SOLN: 50.0000 mg | INTRAMUSCULAR | 1 refills | Status: DC

## 2017-09-22 MED ORDER — FLUPHENAZINE DECANOATE 25 MG/ML IJ SOLN: 50.0000 mg | INTRAMUSCULAR | Status: DC

## 2017-09-22 NOTE — Social Work (Signed)
CSW obtained service history on client from [facility]; patients current provider is Publishing rights manager for [facility] and [facility]. Does not have care coordinator at this time.    Santa Genera, LCSW Lead Clinical Social Worker Phone:  [phone]

## 2017-09-22 NOTE — Progress Notes (Signed)
D: Pt denies SI/HI/AVH. Pt is pleasant and cooperative. Pt appears very suspicious and paranoid on approach, pt presents bizarre at times. Pt stated she wanted to move back to [place] with her family . Pt isolated to her room most of the evening  A: Pt was offered support and encouragement. Pt was given scheduled medications. Pt was encourage to attend groups. Q 15 minute checks were done for safety.   R:Pt is taking medication. Pt has no complaints.Pt receptive to treatment and safety maintained on unit.

## 2017-09-22 NOTE — Plan of Care (Signed)
Problem: Safety: Goal: Ability to remain free from injury will improve Outcome: Progressing Pt safe on the unit at this time   

## 2017-09-22 NOTE — Progress Notes (Signed)
Patient is up, had dinner, talking on the phone at this time, Patient is alert and oriented. q 15 minute checks in progress for safety. Patient has stayed in the bed most of the day.

## 2017-09-22 NOTE — Progress Notes (Signed)
Patient had Im injection of prolixin, Patient tolerated without difficulty, Patient talked to nurse about her family, church and how she hopes to go back to [facility], patient did smile, but mostly has flat affect, she has been sleeping a lot, nurse will continue to monitor, q 15 minute checks for safety.

## 2017-09-22 NOTE — [facility] Group Notes (Signed)
LCSW Group Therapy Note  09/22/2017 1:15pm  Type of Therapy/Topic:  Group Therapy:  Balance in Life  Participation Level:  Did Not Attend, remained in room  Description of Group:    This group will address the concept of balance and how it feels and looks when one is unbalanced. Patients will be encouraged to process areas in their lives that are out of balance and identify reasons for remaining unbalanced. Facilitators will guide patients in utilizing problem-solving interventions to address and correct the stressor making their life unbalanced. Understanding and applying boundaries will be explored and addressed for obtaining and maintaining a balanced life. Patients will be encouraged to explore ways to assertively make their unbalanced needs known to significant others in their lives, using other group members and facilitator for support and feedback.  Therapeutic Goals: 1. Patient will identify two or more emotions or situations they have that consume much of in their lives. 2. Patient will identify signs/triggers that life has become out of balance:  3. Patient will identify two ways to set boundaries in order to achieve balance in their lives:  4. Patient will demonstrate ability to communicate their needs through discussion and/or role plays  Summary of Patient Progress:      Therapeutic Modalities:   Cognitive Behavioral Therapy Solution-Focused Therapy Assertiveness Training  Sallee Lange, LCSW 09/22/2017 4:16 PM

## 2017-09-22 NOTE — [facility] Group Notes (Signed)
[facility] Group Notes:  (Nursing/MHT/Case Management/Adjunct)  Date:  09/22/2017  Time:  9:47 PM  Type of Therapy:  Group Therapy  Participation Level:  Did Not Attend Summary of Progress/Problems:  Mayra Neer 09/22/2017, 9:47 PM

## 2017-09-22 NOTE — Discharge Summary (Signed)
Physician Discharge Summary Note  Patient:  Laura Briggs is an 26 y.o., female MRN:  [id number] DOB:  [birth year] Patient phone:  [phone] (home)  Patient address:   [address] [place] [address],  Total Time spent with patient: 30 minutes  Date of Admission:  09/12/2017 Date of Discharge: 09/22/2017  Reason for Admission:  Psychotic break.  Identifying data. Laura Briggs is a 26 year old female with history of schizophrenia.  Chief complaint. "Do you think it is my voice."  History of present illness. Information was obtained from the patient and the chart. The patient came to the emergency room after she eloped from her group home for 2 days, staying with her friend, not taking medications, taking "Molly". She became acutely psychotic with loud demeaning auditory hallucinations and paranoia. She called her father and was brought to the emergency room. The patient has a long history of schizophrenia. She has been maintained on a combination of lithium, Abilify, Zyprexa, and Latuda in the care of Dr. Janeece Riggers. She has been working with Dr. Janeece Riggers for the past 6 months but her auditory hallucinations have never gone away. She has been dating her current group home for 6 months as well. She believes that they have always mistreated her and the situation has gotten worse recently forcing her to run away. She denies depressive symptoms or symptoms suggestive of bipolar mania. She has anxiety attacks sometimes twice a day depending on the strength and content of her voices. The voices never go away. At best she hears noises that she compares to football game crowd. She denies PTSD or OCD symptoms. While staying with a friend for 2 days she says she used marijuana and Molly.   Past psychiatric history. She was diagnosed with mental illness which she was in college at the age of 15. She has 3 prior hospitalization. She has been tried on multiple medications with no success. She has tremor from  Haldol and liver problems. Depakote. Her current regimen in LITHIUM and 3 second generation antipsychotics. She denies ever attempting suicide.  Family psychiatric history. Nonreported.  Social history. She is an incompetent adult. Her father is the guardian. She has been at her current group home for 6 months but does not want to return there. Reportedly her father has already identified another facility. She is disabled from mental illness.  No new information was obtained.  Principal Problem: Schizophrenia, paranoid [facility]) Discharge Diagnoses: Patient Active Problem List   Diagnosis Date Noted  . Schizophrenia (HCC) [F20.9] 09/12/2017  . Cocaine use disorder, moderate, dependence (HCC) [F14.20] 09/12/2017  . Noncompliance [Z91.19] 09/12/2017  . Schizophrenia, paranoid (HCC) [F20.0] 09/12/2017  . Tobacco use disorder [F17.200] 09/12/2017     Past Medical History:  Past Medical History:  Diagnosis Date  . Anemia    History reviewed. No pertinent surgical history. Family History: History reviewed. No pertinent family history.  Social History:  History  Alcohol Use  . Yes     History  Drug Use  . Types: Other-see comments    Comment: molly per patient    Social History   Social History  . Marital status: Single    Spouse name: N/A  . Number of children: N/A  . Years of education: N/A   Social History Main Topics  . Smoking status: Current Every Day Smoker    Packs/day: 0.50    Years: 1.00    Types: Cigarettes    Start date: 09/13/2016  . Smokeless tobacco: Current User  .  Alcohol use Yes  . Drug use: Yes    Types: Other-see comments     Comment: molly per patient  . Sexual activity: Not Asked   Other Topics Concern  . None   Social History Narrative  . None    Hospital Course:    Laura Briggs is a 26 year old female with a history of schizophrenia admitted for psychotic break in the context of treatment discontinuation for several days.  1.  Psychosis. We discontinued Abilify and Latuda. We continued Zyprexa 30 mg nightly and added Prolixin 10 mg twice daily for psychosis with her guardian approval, the patient received 50 mg of Prolixin decanoate on 10/10 to be continued every 2 weeks. We restarted lithium 400 mg twice daily for mood stability. Lithium level was 0.6. The patient still had some auditory hallucination and possibly mild paranoia. We had a lengthy discussion with the guardian about taking Clozaril but the guardian felt that the risk was 2 grades to take.   2.  Substance abuse. For 1 weekend when she was away from the group home the patient used substances. She was positive for cocaine on admission. She is not interested in substance abuse treatment program participation.   3. Insomnia. Resolved.  4. Smoking. Nicotine patch was available.  5. Metabolic syndrome monitoring. Lipid panel, TSH, and hemoglobin A1c were normal.  6. EKG. Normal sinus rhythm, QTC 418.  7. Social. She is an incompetent adult. Her father is the guardian.  8. Disposition. It was my understanding yesterday that the patient will go home with her guardian who would then placed her in a group home. The guardian was unable pick up the patient last night. He is not available through the weekend. I spoke with him extensively. We agreed that the patient will be discharged today to her old group home. The father will make further arrangements. The patient will follow up with Dr. Janeece Riggers at [facility].  Physical Findings: AIMS:  , ,  ,  ,    CIWA:    COWS:     Musculoskeletal: Strength & Muscle Tone: within normal limits Gait & Station: normal Patient leans: N/A  Psychiatric Specialty Exam: Physical Exam  Nursing note and vitals reviewed. Psychiatric: She has a normal mood and affect. Her speech is normal. Thought content normal. She is actively hallucinating. Cognition and memory are normal. She expresses impulsivity.    Review of Systems   Psychiatric/Behavioral: Positive for hallucinations and substance abuse.  All other systems reviewed and are negative.   Blood pressure (!) 99/55, pulse 86, temperature 98.2 F (36.8 C), temperature source Oral, resp. rate 16, height  (1.651 m), weight 95.3 kg (210 lb), SpO2 97 %.Body mass index is 34.95 kg/m.  General Appearance: Casual  Eye Contact:  Good  Speech:  Clear and Coherent  Volume:  Normal  Mood:  Euthymic  Affect:  Appropriate  Thought Process:  Goal Directed and Descriptions of Associations: Intact  Orientation:  Full (Time, Place, and Person)  Thought Content:  Hallucinations: Auditory  Suicidal Thoughts:  No  Homicidal Thoughts:  No  Memory:  Immediate;   Fair Recent;   Fair Remote;   Fair  Judgement:  Poor  Insight:  Lacking  Psychomotor Activity:  Normal  Concentration:  Concentration: Fair and Attention Span: Fair  Recall:  [employer] of Knowledge:  Fair  Language:  Fair  Akathisia:  No  Handed:  Right  AIMS (if indicated):     Assets:  Communication Skills  Desire for Improvement Financial Resources/Insurance Housing Physical Health Resilience Social Support  ADL's:  Intact  Cognition:  WNL  Sleep:  Number of Hours: 6.3        Has this patient used any form of tobacco in the last 30 days? (Cigarettes, Smokeless Tobacco, Cigars, and/or Pipes) Yes, Yes, A prescription for an FDA-approved tobacco cessation medication was offered at discharge and the patient refused  Blood Alcohol level:  Lab Results  Component Value Date   ETH <10 09/11/2017    Metabolic Disorder Labs:  Lab Results  Component Value Date   HGBA1C 5.1 09/13/2017   MPG 99.67 09/13/2017   No results found for: PROLACTIN Lab Results  Component Value Date   CHOL 205 (H) 09/13/2017   TRIG 198 (H) 09/13/2017   HDL 30 (L) 09/13/2017   CHOLHDL 6.8 09/13/2017   VLDL 40 09/13/2017   LDLCALC 135 (H) 09/13/2017    See Psychiatric Specialty Exam and Suicide Risk Assessment  completed by Attending Physician prior to discharge.  Discharge destination:  Home  Is patient on multiple antipsychotic therapies at discharge:  Yes,   Do you recommend tapering to monotherapy for antipsychotics?  No   Has Patient had three or more failed trials of antipsychotic monotherapy by history:  Yes,   Antipsychotic medications that previously failed include:   1.  haldol., 2.  seroquel. and 3.  risperdal.  Recommended Plan for Multiple Antipsychotic Therapies: Additional reason(s) for multiple antispychotic treatment:  inadequate response to a single agent.  Discharge Instructions    Diet - low sodium heart healthy    Complete by:  As directed    Increase activity slowly    Complete by:  As directed      Allergies as of 09/22/2017   No Known Allergies     Medication List    TAKE these medications     Indication  fluPHENAZine 10 MG tablet Commonly known as:  PROLIXIN Take 1 tablet (10 mg total) by mouth 2 (two) times daily at 8 am and 10 pm.  Indication:  Psychosis   fluPHENAZine decanoate 25 MG/ML injection Commonly known as:  PROLIXIN Inject 2 mLs (50 mg total) into the muscle every 14 (fourteen) days. Next injection on 10/05/2017.  Indication:  Schizophrenia   lithium carbonate 450 MG CR tablet Commonly known as:  ESKALITH Take 1 tablet (450 mg total) by mouth 2 (two) times daily at 8 am and 10 pm.  Indication:  Schizoaffective Disorder   OLANZapine 15 MG tablet Commonly known as:  ZYPREXA Take 2 tablets (30 mg total) by mouth at bedtime.  Indication:  Schizophrenia      Follow-up Information    [facility], [place]. Go on 09/27/2017.   Why:  Please attend your appointment with Dr Janeece Riggers on Tuesday, 09/27/17, at 9:00am.  Please bring a copy of your hospital discharge paperwork. Contact information: [address] [place] [address] [phone]           Follow-up recommendations:  Activity:  as tolerated. Diet:  low sodium heart  healthy. Other:  keep follow up appointments.  Comments:    Signed: Kristine Linea, MD 09/22/2017, 8:02 AM

## 2017-09-22 NOTE — [facility] Counselor (Signed)
Complex Case Management Note   Does the patient have a legal guardian (if yes, please add name and contact information of guardian):  Yes,  Rita Vialpando, [phone]   Has guardianship paperwork been requested/received?: Yes,  per CSW Laws, has been requested   Does the patient have an IDD diagnosis?: No.   Does the patient have a care coordinator?: No.   If there is no care coordinator, has a referral been made?: yes, 10/11  [employer] (and contact information if applicable): father  Outside agencies involved: [employer] [facility] will do medications management; Publishing rights manager for community support and psychosocial rehab in [place]  Medical needs/complications (eg. Diabetic, C-PAP, O2, ADLs): No.   Which ALF/FCHs has patient been referred to (i.e. Name of facility, contact, and date of referral):  Name of Facility Contact Information Date of referral What else is needed?                        Other:  10/11:  Per father, hopes that patient will be accepted by [facility] in [place].  Will return [facility] at discharge until father can make transition to new group home.  Father uncomfortable with patient returning to his home because "when she was here the last time, she brought people into the home during the day while he was at work.  [facility] is waiting for immunization records and other paperwork from "Gwenith Spitz of current group home.    Sallee Lange, LCSW 09/22/2017 4:17 PM

## 2017-09-22 NOTE — [facility] Suicide Risk Assessment (Signed)
[facility] Discharge Suicide Risk Assessment   Principal Problem: Schizophrenia, paranoid [facility]) Discharge Diagnoses:  Patient Active Problem List   Diagnosis Date Noted  . Schizophrenia (HCC) [F20.9] 09/12/2017  . Cocaine use disorder, moderate, dependence (HCC) [F14.20] 09/12/2017  . Noncompliance [Z91.19] 09/12/2017  . Schizophrenia, paranoid (HCC) [F20.0] 09/12/2017  . Tobacco use disorder [F17.200] 09/12/2017    Total Time spent with patient: 30 minutes  Musculoskeletal: Strength & Muscle Tone: within normal limits Gait & Station: normal Patient leans: N/A  Psychiatric Specialty Exam: Review of Systems  Psychiatric/Behavioral: Positive for hallucinations.  All other systems reviewed and are negative.   Blood pressure (!) 99/55, pulse 86, temperature 98.2 F (36.8 C), temperature source Oral, resp. rate 16, height  (1.651 m), weight 95.3 kg (210 lb), SpO2 97 %.Body mass index is 34.95 kg/m.  General Appearance: Casual  Eye Contact::  Good  Speech:  Clear and Coherent409  Volume:  Normal  Mood:  Euthymic  Affect:  Appropriate  Thought Process:  Goal Directed and Descriptions of Associations: Intact  Orientation:  Full (Time, Place, and Person)  Thought Content:  Hallucinations: Auditory  Suicidal Thoughts:  No  Homicidal Thoughts:  No  Memory:  Immediate;   Fair Recent;   Fair Remote;   Fair  Judgement:  Poor  Insight:  Lacking  Psychomotor Activity:  Normal  Concentration:  Fair  Recall:  [employer] of Knowledge:Fair  Language: Fair  Akathisia:  No  Handed:  Right  AIMS (if indicated):     Assets:  Communication Skills Desire for Improvement Financial Resources/Insurance Housing Physical Health Resilience Social Support  Sleep:  Number of Hours: 6.3  Cognition: WNL  ADL's:  Intact   Mental Status Per Nursing Assessment::   On Admission:  NA  Demographic Factors:  Adolescent or young adult  Loss Factors: NA  Historical Factors: Prior suicide  attempts, Family history of mental illness or substance abuse and Impulsivity  Risk Reduction Factors:   Sense of responsibility to family, Living with another person, especially a relative, Positive social support and Positive therapeutic relationship  Continued Clinical Symptoms:  Alcohol/Substance Abuse/Dependencies Schizophrenia:   Depressive state Less than 103 years old Paranoid or undifferentiated type  Cognitive Features That Contribute To Risk:  None    Suicide Risk:  Minimal: No identifiable suicidal ideation.  Patients presenting with no risk factors but with morbid ruminations; may be classified as minimal risk based on the severity of the depressive symptoms  Follow-up Information    [facility]. Go on 09/27/2017.   Why:  Please attend your appointment with Dr Janeece Riggers on Tuesday, 09/27/17, at 9:00am.  Please bring a copy of your hospital discharge paperwork. Contact information: [address] [place] [address] [phone]           Plan Of Care/Follow-up recommendations:  Activity:  As tolerated. Diet:  low sodium heart healthy. Other:  keep follow up appointments.  Kristine Linea, MD 09/22/2017, 7:44 AM

## 2017-09-22 NOTE — Progress Notes (Signed)
[facility] Laura Briggs Progress Note  09/22/2017 1:59 PM Laura Briggs  MRN:  [id number]  Subjective:  Laura Briggs is a 26 year old female with a history of schizophrenia admitted for a psychotic break following a brief intervention on drugs and discontinuation of her medicines.  The patient has been improving gradually over the past few days and yesterday she was scheduled for discharge. She denied any symptoms of depression, anxiety, or psychosis. She was out in the community interacting appropriately with peers and staff. She was engaging during the interview. Her father was supposed to pick her up and take her home before she was placed in a group home next week. The father did not come and decided that the patient will return to her group home I spoke with her last night. This morning the patient is actively hallucinating, not engaging, posturing. She will not be discharged. I spoke with the guardian again asking his permission to start clozapine. He will "think about it". Per nursing report the patient is compliant with treatment. She does not participate in programming again.  Treatment plan. We will continue Zyprexa 30 mg nightly and Prolixin 10 mg twice daily for now for psychosis. The guardian through injection of 50 mg of Prolixin decanoate every 2 weeks to improve compliance. We'll continue lithium. We'll continue negotiations with regard about clozapine.  Social/disposition. The patient is incompetent adult. Her father is the guardian. She resides in a group home but they are eliminated there and arrangements on 10/14. She will be placed in a group home closer to [facility] where the guardian resides.   Past psychiatric history. Diagnosed with schizophrenia at the age of 20. She is been tried on multiple medications. She has numerous [facility] She was never on clozapine.  Family psychiatric history. Nonreported.  Principal Problem: Schizophrenia,  paranoid (HCC) Diagnosis:   Patient Active Problem List   Diagnosis Date Noted  . Schizophrenia (HCC) [F20.9] 09/12/2017  . Cocaine use disorder, moderate, dependence (HCC) [F14.20] 09/12/2017  . Noncompliance [Z91.19] 09/12/2017  . Schizophrenia, paranoid (HCC) [F20.0] 09/12/2017  . Tobacco use disorder [F17.200] 09/12/2017   Total Time spent with patient: 30 minutes  Past Medical History:  Past Medical History:  Diagnosis Date  . Anemia    History reviewed. No pertinent surgical history. Family History: History reviewed. No pertinent family history.  Social History:  History  Alcohol Use  . Yes     History  Drug Use  . Types: Other-see comments    Comment: molly per patient    Social History   Social History  . Marital status: Single    Spouse name: N/A  . Number of children: N/A  . Years of education: N/A   Social History Main Topics  . Smoking status: Current Every Day Smoker    Packs/day: 0.50    Years: 1.00    Types: Cigarettes    Start date: 09/13/2016  . Smokeless tobacco: Current User  . Alcohol use Yes  . Drug use: Yes    Types: Other-see comments     Comment: molly per patient  . Sexual activity: Not Asked   Other Topics Concern  . None   Social History Narrative  . None   Additional Social History:                         Sleep: Fair  Appetite:  Fair  Current Medications: Current Facility-Administered Medications  Medication Dose Route  Frequency Provider Last Rate Last Dose  . acetaminophen (TYLENOL) tablet 650 mg  650 mg Oral Q6H PRN Briggs, Laura Briggs, Laura Briggs      . alum & mag hydroxide-simeth (MAALOX/MYLANTA) 200-200-20 MG/5ML suspension 30 mL  30 mL Oral Q4H PRN Briggs, Laura Briggs, Laura Briggs      . fluPHENAZine (PROLIXIN) tablet 10 mg  10 mg Oral BID AC & HS Laura Briggs, Laura Briggs   10 mg at 09/22/17 0958  . fluPHENAZine decanoate (PROLIXIN) injection 50 mg  50 mg Intramuscular Q14 Days Laura Briggs, Laura Briggs   50 mg at 09/22/17 0956   . lithium carbonate (ESKALITH) CR tablet 450 mg  450 mg Oral BID AC & HS Laura Briggs, Laura Briggs   450 mg at 09/22/17 0958  . magnesium hydroxide (MILK OF MAGNESIA) suspension 30 mL  30 mL Oral Daily PRN Briggs, Laura Briggs, Laura Briggs      . nicotine (NICODERM CQ - dosed in mg/24 hours) patch 21 mg  21 mg Transdermal Daily Laura Briggs, Laura Briggs      . nicotine polacrilex (NICORETTE) gum 2 mg  2 mg Oral PRN Laura Briggs, Laura Briggs   2 mg at 09/21/17 0841  . OLANZapine (ZYPREXA) tablet 30 mg  30 mg Oral QHS Laura Briggs, Laura Briggs   30 mg at 09/21/17 2140    Lab Results: No results found for this or any previous visit (from the past 48 hour(s)).  Blood Alcohol level:  Lab Results  Component Value Date   ETH <10 09/11/2017    Metabolic Disorder Labs: Lab Results  Component Value Date   HGBA1C 5.1 09/13/2017   MPG 99.67 09/13/2017   No results found for: PROLACTIN Lab Results  Component Value Date   CHOL 205 (H) 09/13/2017   TRIG 198 (H) 09/13/2017   HDL 30 (L) 09/13/2017   CHOLHDL 6.8 09/13/2017   VLDL 40 09/13/2017   LDLCALC 135 (H) 09/13/2017    Physical Findings: AIMS:  , ,  ,  ,    CIWA:    COWS:     Musculoskeletal: Strength & Muscle Tone: within normal limits Gait & Station: normal Patient leans: N/A  Psychiatric Specialty Exam: Physical Exam  Nursing note and vitals reviewed. Psychiatric: Her affect is blunt. Her speech is delayed. She is withdrawn and actively hallucinating. Thought content is paranoid and delusional. Cognition and memory are normal. She expresses impulsivity.    Review of Systems  Neurological: Negative.   Psychiatric/Behavioral: Positive for hallucinations.  All other systems reviewed and are negative.   Blood pressure (!) 99/55, pulse 86, temperature 98.2 F (36.8 C), temperature source Oral, resp. rate 16, height  (1.651 m), weight 95.3 kg (210 lb), SpO2 97 %.Body mass index is 34.95 kg/m.  General Appearance: Casual  Eye Contact:  Fair   Speech:  Slow  Volume:  Decreased  Mood:  Depressed  Affect:  Blunt  Thought Process:  Linear  Orientation:  Full (Time, Place, and Person)  Thought Content:  Delusions, Hallucinations: Auditory and Paranoid Ideation  Suicidal Thoughts:  No  Homicidal Thoughts:  No  Memory:  Immediate;   Fair Recent;   Fair Remote;   Fair  Judgement:  Poor  Insight:  Lacking  Psychomotor Activity:  Mannerisms  Concentration:  Concentration: Fair and Attention Span: Fair  Recall:  [employer] of Knowledge:  Fair  Language:  Fair  Akathisia:  No  Handed:  Right  AIMS (if indicated):  Assets:  Communication Skills Desire for Improvement Financial Resources/Insurance Housing Physical Health Resilience Social Support  ADL's:  Intact  Cognition:  WNL  Sleep:  Number of Hours: 6.3     Treatment Plan Summary: Daily contact with patient to assess and evaluate symptoms and progress in treatment and Medication management   Ms. Kotula is a 26 year old female with a history of schizophrenia admitted for psychotic break in the context of treatment discontinuation for several days.  1. Psychosis. The patient was started on Zyprexa 30 mg nightly and Prolixin 10 mg twice daily for psychosis. We continued lithium 450 mg twice daily for mood stabilization. Human level was 0.6. She was ordered Prolixin Decanoate injection 50 mg on 1011. I am awaiting her decision about clozapine.  2.  Substance abuse. For 1 weekend when she was away from the group home the patient used substances. She was positive for cocaine on admission. She is not interested in substance abuse treatment program participation.   3. Insomnia. Resolved.  4. Smoking. Nicotine patch was available.  5. Metabolic syndrome monitoring. Lipid panel, TSH, and hemoglobin A1c were normal.  6. EKG. Normal sinus rhythm, QTC 418.  7. Social. She is an incompetent adult. Her father is the guardian.  8. Disposition. To be  established. The father is working on a placement closer to do with him where he lives. She will follow up with Dr. Janeece Riggers at [facility].   Laura Linea, Laura Briggs 09/22/2017, 1:59 PM

## 2017-09-23 ENCOUNTER — Inpatient Hospital Stay: Payer: Medicare Other

## 2017-09-23 MED ORDER — TUBERCULIN PPD 5 UNIT/0.1ML ID SOLN
5.0000 [IU] | Freq: Once | INTRADERMAL | Status: DC
  Filled 2017-09-23: qty 0.1

## 2017-09-23 MED ORDER — FLUPHENAZINE DECANOATE 25 MG/ML IJ SOLN: 50.0000 mg | INTRAMUSCULAR | 1 refills | Status: AC

## 2017-09-23 MED ORDER — OLANZAPINE 15 MG PO TABS: 30.0000 mg | ORAL_TABLET | Freq: Every day | ORAL | 1 refills | Status: AC

## 2017-09-23 MED ORDER — FLUPHENAZINE HCL 10 MG PO TABS
10.0000 mg | ORAL_TABLET | Freq: Every day | ORAL | 1 refills | Status: AC
Start: 2017-09-23 — End: ?

## 2017-09-23 MED ORDER — INFLUENZA VAC SPLIT QUAD 0.5 ML IM SUSY
0.5000 mL | PREFILLED_SYRINGE | INTRAMUSCULAR | Status: AC
  Administered 2017-09-23: 0.5 mL via INTRAMUSCULAR
  Filled 2017-09-23: qty 0.5

## 2017-09-23 MED ORDER — PNEUMOCOCCAL 13-VAL CONJ VACC IM SUSP
0.5000 mL | INTRAMUSCULAR | Status: AC
  Administered 2017-09-23: 0.5 mL via INTRAMUSCULAR
  Filled 2017-09-23: qty 0.5

## 2017-09-23 MED ORDER — LITHIUM CARBONATE ER 450 MG PO TBCR: 450.0000 mg | EXTENDED_RELEASE_TABLET | Freq: Two times a day (BID) | ORAL | 1 refills | Status: AC

## 2017-09-23 NOTE — [facility] Group Notes (Signed)
[facility] LCSW Group Therapy Note  Date/Time: 09/23/17, 1300  Type of Therapy and Topic:  Group Therapy:  Feelings around Relapse and Recovery  Participation Level:  Did Not Attend   Mood:  Description of Group:    Patients in this group will discuss emotions they experience before and after a relapse. They will process how experiencing these feelings, or avoidance of experiencing them, relates to having a relapse. Facilitator will guide patients to explore emotions they have related to recovery. Patients will be encouraged to process which emotions are more powerful. They will be guided to discuss the emotional reaction significant others in their lives may have to patients' relapse or recovery. Patients will be assisted in exploring ways to respond to the emotions of others without this contributing to a relapse.  Therapeutic Goals: 1. Patient will identify two or more emotions that lead to relapse for them:  2. Patient will identify two emotions that result when they relapse:  3. Patient will identify two emotions related to recovery:  4. Patient will demonstrate ability to communicate their needs through discussion and/or role plays.   Summary of Patient Progress:     Therapeutic Modalities:   Cognitive Behavioral Therapy Solution-Focused Therapy Assertiveness Training Relapse Prevention Therapy  Greg Gaylynn Seiple, LCSW       

## 2017-09-23 NOTE — Plan of Care (Signed)
Problem: Education: Goal: Knowledge of the prescribed therapeutic regimen will improve Outcome: Progressing Patient is aware of her disease processes and treatment modalities  Problem: Coping: Goal: Ability to cope will improve Outcome: Not Progressing Patient is refusing to participate in groups and stays in room most of the time

## 2017-09-23 NOTE — Plan of Care (Signed)
Problem: Activity: Goal: Will verbalize the importance of balancing activity with adequate rest periods Outcome: Progressing Patient spent time in the dayroom and was observed not responding to unseen others while there.  She was assertive when asking for fluids prior to HS snack.  She talked to Clinical research associate about being glad she will be discharged tomorrow.  Affect animated when discussing discharge.  Problem: Safety: Goal: Ability to redirect hostility and anger into socially appropriate behaviors will improve Outcome: Progressing Patient was calm and behavior was appropriate this evening.  She was overheard singing and "rapping" while taking a shower.  She was given information about flu and pneumonia immunizations.  She accepted injections without problems/concerns.

## 2017-09-23 NOTE — [facility] Suicide Risk Assessment (Signed)
CSW spoke to legal guardian Ingra Rother about discharge.  He would be able to pick pt up on Saturday, 10/13.  This will be the plan. [name], MSW, LCSW Clinical Social Worker 09/23/2017 2:35 PM

## 2017-09-23 NOTE — Progress Notes (Signed)
[facility] MD Progress Note  09/23/2017 2:09 PM Martha Ellerby  MRN:  [id number]  Subjective:  Mr. Inscoe is a 26 year old female with a history of schizophrenia admitted for a psychotic break following a brief relapse on drugs and discontinuation of her medicines.  Today the patient looks much better. There is no bizarre behaviors or posturing. She is in her room reading a book. She is halfway through. She can have a sensible conversation about discharge. She reminds me that in order to be placed in a new group home she will need a shot, pneumonia shot and PPD. This was ordered. Unfortunately Prolixin Decanoate was not available from the pharmacy until today. The family decided against Clozapine treatment. Sleep and appetite are fair. There are no somatic complaints. She denies any symptoms of depression, anxiety, psychosis, suicidal or homicidal ideation. She is ready for discharge.   Treatment plan.  We will continue Zyprexa 30 mg nightly and Prolixin 10 mg twice daily, until her Prolixin Decanoate injection is given, for psychosis. We will continue lithium 450 mg twice daily for mood stabilization. The patient is to be placed in a new group home by her father who is the guardian. There is a possibility that he would like to pick her up from the hospital over the weekend. Nursing staff and social worker is aware of the situation. We are awaiting father's final decision.   Social/disposition. The patient is incompetent adult. Her father is the guardian. She resides in a group home but they are eliminated there and arrangements on 10/14. She will be placed in a group home closer to [facility] where the guardian resides.   Past psychiatric history. Diagnosed with schizophrenia at the age of 61. She is been tried on multiple medications. She has numerous [facility] She was never on clozapine.  Family psychiatric history. Nonreported.  No other new  information was available.   Principal Problem: Schizophrenia, paranoid (HCC) Diagnosis:   Patient Active Problem List   Diagnosis Date Noted  . Schizophrenia (HCC) [F20.9] 09/12/2017  . Cocaine use disorder, moderate, dependence (HCC) [F14.20] 09/12/2017  . Noncompliance [Z91.19] 09/12/2017  . Schizophrenia, paranoid (HCC) [F20.0] 09/12/2017  . Tobacco use disorder [F17.200] 09/12/2017   Total Time spent with patient: 30 minutes  Past Medical History:  Past Medical History:  Diagnosis Date  . Anemia    History reviewed. No pertinent surgical history. Family History: History reviewed. No pertinent family history.  Social History:  History  Alcohol Use  . Yes     History  Drug Use  . Types: Other-see comments    Comment: molly per patient    Social History   Social History  . Marital status: Single    Spouse name: N/A  . Number of children: N/A  . Years of education: N/A   Social History Main Topics  . Smoking status: Current Every Day Smoker    Packs/day: 0.50    Years: 1.00    Types: Cigarettes    Start date: 09/13/2016  . Smokeless tobacco: Current User  . Alcohol use Yes  . Drug use: Yes    Types: Other-see comments     Comment: molly per patient  . Sexual activity: Not Asked   Other Topics Concern  . None   Social History Narrative  . None   Additional Social History:  Sleep: Fair  Appetite:  Fair  Current Medications: Current Facility-Administered Medications  Medication Dose Route Frequency Provider Last Rate Last Dose  . acetaminophen (TYLENOL) tablet 650 mg  650 mg Oral Q6H PRN Clapacs, John T, MD      . alum & mag hydroxide-simeth (MAALOX/MYLANTA) 200-200-20 MG/5ML suspension 30 mL  30 mL Oral Q4H PRN Clapacs, John T, MD      . fluPHENAZine (PROLIXIN) tablet 10 mg  10 mg Oral BID AC & HS Minal Stuller B, MD   10 mg at 09/23/17 0850  . [START ON 10/06/2017] fluPHENAZine decanoate (PROLIXIN) injection  50 mg  50 mg Intramuscular Q14 Days Ambreen Tufte B, MD      . lithium carbonate (ESKALITH) CR tablet 450 mg  450 mg Oral BID AC & HS Johnathin Vanderschaaf B, MD   450 mg at 09/23/17 0850  . magnesium hydroxide (MILK OF MAGNESIA) suspension 30 mL  30 mL Oral Daily PRN Clapacs, John T, MD      . nicotine (NICODERM CQ - dosed in mg/24 hours) patch 21 mg  21 mg Transdermal Daily Tremaine Earwood B, MD      . nicotine polacrilex (NICORETTE) gum 2 mg  2 mg Oral PRN Darliss Ridgel, MD   2 mg at 09/21/17 0841  . OLANZapine (ZYPREXA) tablet 30 mg  30 mg Oral QHS Sunshyne Horvath B, MD   30 mg at 09/22/17 2125  . tuberculin injection 5 Units  5 Units Intradermal Once Jaelan Rasheed B, MD        Lab Results: No results found for this or any previous visit (from the past 48 hour(s)).  Blood Alcohol level:  Lab Results  Component Value Date   ETH <10 09/11/2017    Metabolic Disorder Labs: Lab Results  Component Value Date   HGBA1C 5.1 09/13/2017   MPG 99.67 09/13/2017   No results found for: PROLACTIN Lab Results  Component Value Date   CHOL 205 (H) 09/13/2017   TRIG 198 (H) 09/13/2017   HDL 30 (L) 09/13/2017   CHOLHDL 6.8 09/13/2017   VLDL 40 09/13/2017   LDLCALC 135 (H) 09/13/2017    Physical Findings: AIMS:  , ,  ,  ,    CIWA:    COWS:     Musculoskeletal: Strength & Muscle Tone: within normal limits Gait & Station: normal Patient leans: N/A  Psychiatric Specialty Exam: Physical Exam  Nursing note and vitals reviewed. Psychiatric: Thought content normal. Her affect is blunt. Her speech is delayed. She is actively hallucinating. Cognition and memory are normal. She expresses impulsivity.    Review of Systems  Neurological: Negative.   Psychiatric/Behavioral: Positive for hallucinations.  All other systems reviewed and are negative.   Blood pressure 109/78, pulse 81, temperature 98.2 F (36.8 C), temperature source Oral, resp. rate 18, height  (1.651  m), weight 95.3 kg (210 lb), SpO2 97 %.Body mass index is 34.95 kg/m.  General Appearance: Casual  Eye Contact:  Fair  Speech:  Slow  Volume:  Decreased  Mood:  Depressed  Affect:  Blunt  Thought Process:  Linear  Orientation:  Full (Time, Place, and Person)  Thought Content:  Delusions, Hallucinations: Auditory and Paranoid Ideation  Suicidal Thoughts:  No  Homicidal Thoughts:  No  Memory:  Immediate;   Fair Recent;   Fair Remote;   Fair  Judgement:  Poor  Insight:  Lacking  Psychomotor Activity:  Mannerisms  Concentration:  Concentration: Fair and Attention Span: Fair  Recall:  Jennelle Human of Knowledge:  Fair  Language:  Fair  Akathisia:  No  Handed:  Right  AIMS (if indicated):     Assets:  Communication Skills Desire for Improvement Financial Resources/Insurance Housing Physical Health Resilience Social Support  ADL's:  Intact  Cognition:  WNL  Sleep:  Number of Hours: 8     Treatment Plan Summary: Daily contact with patient to assess and evaluate symptoms and progress in treatment and Medication management   Ms. Maragh is a 26 year old female with a history of schizophrenia admitted for psychotic break in the context of treatment discontinuation for several days.  1. Psychosis. The patient was started on Zyprexa 30 mg nightly and Prolixin 10 mg twice daily for psychosis. We continued lithium 450 mg twice daily for mood stabilization. Lithium level was 0.6. She was ordered Prolixin Decanoate injection 50 mg on 10/11 but it was not available until 10/12 The family resists Clozapine.   2.  Substance abuse. For 1 weekend when she was away from the group home the patient used substances. She was positive for cocaine on admission. She is not interested in substance abuse treatment program participation.   3. Insomnia. Resolved.  4. Smoking. Nicotine patch was available.  5. Metabolic syndrome monitoring. Lipid panel, TSH, and hemoglobin A1c were normal.  6.  EKG. Normal sinus rhythm, QTC 418.  7. Social. She is an incompetent adult. Her father is the guardian.  8. Disposition.she will be discharged to home with her father were then placed her in a group home. She will follow up with Dr. Janeece Riggers at [facility].    Kristine Linea, MD 09/23/2017, 2:09 PM

## 2017-09-23 NOTE — [facility] Suicide Risk Assessment (Signed)
[facility] Discharge Suicide Risk Assessment   Principal Problem: Schizophrenia, paranoid [facility]) Discharge Diagnoses:  Patient Active Problem List   Diagnosis Date Noted  . Schizophrenia (HCC) [F20.9] 09/12/2017  . Cocaine use disorder, moderate, dependence (HCC) [F14.20] 09/12/2017  . Noncompliance [Z91.19] 09/12/2017  . Schizophrenia, paranoid (HCC) [F20.0] 09/12/2017  . Tobacco use disorder [F17.200] 09/12/2017    Total Time spent with patient: 30 minutes  Musculoskeletal: Strength & Muscle Tone: within normal limits Gait & Station: normal Patient leans: N/A  Psychiatric Specialty Exam: Review of Systems  Psychiatric/Behavioral: Positive for hallucinations.  All other systems reviewed and are negative.   Blood pressure 109/78, pulse 81, temperature 98.2 F (36.8 C), temperature source Oral, resp. rate 18, height  (1.651 m), weight 95.3 kg (210 lb), SpO2 97 %.Body mass index is 34.95 kg/m.  General Appearance: Casual  Eye Contact::  Good  Speech:  Clear and Coherent409  Volume:  Normal  Mood:  Euthymic  Affect:  Appropriate  Thought Process:  Goal Directed and Descriptions of Associations: Intact  Orientation:  Full (Time, Place, and Person)  Thought Content:  Hallucinations: Auditory  Suicidal Thoughts:  No  Homicidal Thoughts:  No  Memory:  Immediate;   Fair Recent;   Fair Remote;   Fair  Judgement:  Impaired  Insight:  Shallow  Psychomotor Activity:  Normal  Concentration:  Fair  Recall:  [employer] of Knowledge:Fair  Language: Fair  Akathisia:  No  Handed:  Right  AIMS (if indicated):     Assets:  Communication Skills Desire for Improvement Financial Resources/Insurance Housing Physical Health Resilience Social Support  Sleep:  Number of Hours: 8  Cognition: WNL  ADL's:  Intact   Mental Status Per Nursing Assessment::   On Admission:  NA  Demographic Factors:  Adolescent or young adult  Loss Factors: NA  Historical Factors: Prior suicide  attempts, Family history of mental illness or substance abuse and Impulsivity  Risk Reduction Factors:   Sense of responsibility to family, Living with another person, especially a relative, Positive social support and Positive therapeutic relationship  Continued Clinical Symptoms:  Alcohol/Substance Abuse/Dependencies Schizophrenia:   Less than 61 years old  Cognitive Features That Contribute To Risk:  None    Suicide Risk:  Minimal: No identifiable suicidal ideation.  Patients presenting with no risk factors but with morbid ruminations; may be classified as minimal risk based on the severity of the depressive symptoms  Follow-up Information    [facility]. Go on 09/27/2017.   Why:  Please attend your appointment with Dr Janeece Riggers on Tuesday, 09/27/17, at 9:00am.  Please bring a copy of your hospital discharge paperwork. Contact information: [address] [place] [address] [phone]           Plan Of Care/Follow-up recommendations:  Activity:  As tolerated. Diet:  Regular. Other:  Keep follow-up appointments.  Kristine Linea, MD 09/23/2017, 2:41 PM

## 2017-09-23 NOTE — Plan of Care (Signed)
Problem: Coping: Goal: Ability to verbalize feelings will improve Outcome: Not Progressing Isolates to room.  Minimal communication.  Answers yes and no without any elaboration.

## 2017-09-23 NOTE — Discharge Summary (Signed)
Physician Discharge Summary Note  Patient:  Laura Briggs is an 26 y.o., female MRN:  [id number] DOB:  28-Feb-1991 Patient phone:  [phone] (home)  Patient address:   [address] [place] [address],  Total Time spent with patient: 30 minutes  Date of Admission:  09/12/2017 Date of Discharge: 09/24/2017  Reason for Admission:  Psychotic break.  Identifying data. Laura Briggs is a 26 year old female with history of schizophrenia.  Chief complaint. "Do you think it is my voice."  History of present illness. Information was obtained from the patient and the chart. The patient came to the emergency room after she eloped from her group home for 2 days, staying with her friend, not taking medications, taking "Molly". She became acutely psychotic with loud demeaning auditory hallucinations and paranoia. She called her father and was brought to the emergency room. The patient has a long history of schizophrenia. She has been maintained on a combination of lithium, Abilify, Zyprexa, and Latuda in the care of Dr. Janeece Riggers. She has been working with Dr. Janeece Riggers for the past 6 months but her auditory hallucinations have never gone away. She has been dating her current group home for 6 months as well. She believes that they have always mistreated her and the situation has gotten worse recently forcing her to run away. She denies depressive symptoms or symptoms suggestive of bipolar mania. She has anxiety attacks sometimes twice a day depending on the strength and content of her voices. The voices never go away. At best she hears noises that she compares to football game crowd. She denies PTSD or OCD symptoms. While staying with a friend for 2 days she says she used marijuana and Molly.   Past psychiatric history. She was diagnosed with mental illness which she was in college at the age of 54. She has 3 prior hospitalization. She has been tried on multiple medications with no success. She has tremor from  Haldol and liver problems. Depakote. Her current regimen in LITHIUM and 3 second generation antipsychotics. She denies ever attempting suicide.  Family psychiatric history. Nonreported.  Social history. She is an incompetent adult. Her father is the guardian. She has been at her current group home for 6 months but does not want to return there. Reportedly her father has already identified another facility. She is disabled from mental illness.  Principal Problem: Schizophrenia, paranoid [facility]) Discharge Diagnoses: Patient Active Problem List   Diagnosis Date Noted  . Schizophrenia (HCC) [F20.9] 09/12/2017  . Cocaine use disorder, moderate, dependence (HCC) [F14.20] 09/12/2017  . Noncompliance [Z91.19] 09/12/2017  . Schizophrenia, paranoid (HCC) [F20.0] 09/12/2017  . Tobacco use disorder [F17.200] 09/12/2017    Past Medical History:  Past Medical History:  Diagnosis Date  . Anemia    History reviewed. No pertinent surgical history. Family History: History reviewed. No pertinent family history.  Social History:  History  Alcohol Use  . Yes     History  Drug Use  . Types: Other-see comments    Comment: molly per patient    Social History   Social History  . Marital status: Single    Spouse name: N/A  . Number of children: N/A  . Years of education: N/A   Social History Main Topics  . Smoking status: Current Every Day Smoker    Packs/day: 0.50    Years: 1.00    Types: Cigarettes    Start date: 09/13/2016  . Smokeless tobacco: Current User  . Alcohol use Yes  . Drug use:  Yes    Types: Other-see comments     Comment: molly per patient  . Sexual activity: Not Asked   Other Topics Concern  . None   Social History Narrative  . None    Hospital Course:    Laura Briggs is a 26 year old female with a history of schizophrenia admitted for psychotic break in the context of relapse on substances and treatment discontinuation for several days.  1. Psychosis. The  patient was started on Zyprexa 30 mg nightly and Prolixin 10 mg twice daily for psychosis. We continued lithium 450 mg twice daily for mood stabilization. Lithium level was 0.6. She received 50 mg of Prolixin Decanoate injection on 10/12. We lowered oral prolixin to 10 mg nightly. The family resists Clozapine. The patient still hallucinates but no commands.   2. Substance abuse. For 1 weekend when she was away from the group home the patient used substances. She was positive for cocaine on admission. She is not interested in substance abuse treatment program participation.   3. Insomnia. Resolved.  4. Smoking. Nicotine patch was available.  5. Metabolic syndrome monitoring. Lipid panel, TSH, and hemoglobin A1c were normal.  6. EKG. Normal sinus rhythm, QTC 418.  7. Social. She is an incompetent adult. Her father is the guardian.  8. Disposition. She was discharged with her guardian who will place her in a new group home of his choosing. She will follow up with Dr. Janeece Riggers at [facility].    Physical Findings: AIMS:  , ,  ,  ,    CIWA:    COWS:     Musculoskeletal: Strength & Muscle Tone: within normal limits Gait & Station: normal Patient leans: N/A  Psychiatric Specialty Exam: Physical Exam  Nursing note and vitals reviewed. Psychiatric: She has a normal mood and affect. Her speech is normal. She is actively hallucinating. Cognition and memory are normal. She expresses impulsivity.    Review of Systems  Neurological: Negative.   Psychiatric/Behavioral: Positive for hallucinations.  All other systems reviewed and are negative.   Blood pressure 109/78, pulse 81, temperature 98.2 F (36.8 C), temperature source Oral, resp. rate 18, height  (1.651 m), weight 95.3 kg (210 lb), SpO2 97 %.Body mass index is 34.95 kg/m.  General Appearance: Casual  Eye Contact:  Good  Speech:  Clear and Coherent  Volume:  Normal  Mood:  Euthymic  Affect:  Blunt  Thought Process:  Goal Directed  and Descriptions of Associations: Intact  Orientation:  Full (Time, Place, and Person)  Thought Content:  Hallucinations: Auditory  Suicidal Thoughts:  No  Homicidal Thoughts:  No  Memory:  Immediate;   Fair Recent;   Fair Remote;   Fair  Judgement:  Impaired  Insight:  Shallow  Psychomotor Activity:  Normal  Concentration:  Concentration: Fair and Attention Span: Fair  Recall:  [employer] of Knowledge:  Fair  Language:  Fair  Akathisia:  No  Handed:  Right  AIMS (if indicated):     Assets:  Communication Skills Desire for Improvement Financial Resources/Insurance Housing Physical Health Resilience Social Support  ADL's:  Intact  Cognition:  WNL  Sleep:  Number of Hours: 8        Has this patient used any form of tobacco in the last 30 days? (Cigarettes, Smokeless Tobacco, Cigars, and/or Pipes) Yes, Yes, A prescription for an FDA-approved tobacco cessation medication was offered at discharge and the patient refused  Blood Alcohol level:  Lab Results  Component Value Date  ETH <10 09/11/2017    Metabolic Disorder Labs:  Lab Results  Component Value Date   HGBA1C 5.1 09/13/2017   MPG 99.67 09/13/2017   No results found for: PROLACTIN Lab Results  Component Value Date   CHOL 205 (H) 09/13/2017   TRIG 198 (H) 09/13/2017   HDL 30 (L) 09/13/2017   CHOLHDL 6.8 09/13/2017   VLDL 40 09/13/2017   LDLCALC 135 (H) 09/13/2017    See Psychiatric Specialty Exam and Suicide Risk Assessment completed by Attending Physician prior to discharge.  Discharge destination:  Home  Is patient on multiple antipsychotic therapies at discharge:  Yes,   Do you recommend tapering to monotherapy for antipsychotics?  No   Has Patient had three or more failed trials of antipsychotic monotherapy by history:  Yes,   Antipsychotic medications that previously failed include:   1.  abilify., 2.  seroquel. and 3.  latuda.  Recommended Plan for Multiple Antipsychotic Therapies: Additional  reason(s) for multiple antispychotic treatment:  inadequate response to a single agent.   Discharge Instructions    Diet - low sodium heart healthy    Complete by:  As directed    Diet - low sodium heart healthy    Complete by:  As directed    Increase activity slowly    Complete by:  As directed    Increase activity slowly    Complete by:  As directed      Allergies as of 09/23/2017   No Known Allergies     Medication List    TAKE these medications     Indication  fluPHENAZine 10 MG tablet Commonly known as:  PROLIXIN Take 1 tablet (10 mg total) by mouth at bedtime.  Indication:  Psychosis   fluPHENAZine decanoate 25 MG/ML injection Commonly known as:  PROLIXIN Inject 2 mLs (50 mg total) into the muscle every 14 (fourteen) days.  Indication:  Schizophrenia   lithium carbonate 450 MG CR tablet Commonly known as:  ESKALITH Take 1 tablet (450 mg total) by mouth 2 (two) times daily at 8 am and 10 pm.  Indication:  Schizoaffective Disorder   OLANZapine 15 MG tablet Commonly known as:  ZYPREXA Take 2 tablets (30 mg total) by mouth at bedtime.  Indication:  Schizophrenia      Follow-up Information    [facility], [place]. Go on 09/27/2017.   Why:  Please attend your appointment with Dr Janeece Riggers on Tuesday, 09/27/17, at 9:00am.  Please bring a copy of your hospital discharge paperwork. Contact information: [address] [place] [address] [phone]           Follow-up recommendations:  Activity:  As tolerated. Diet:  Low sodium heart healthy. Other:  Keep follow-up appointments.  Comments:    Signed: Kristine Linea, MD 09/23/2017, 2:45 PM

## 2017-09-23 NOTE — Progress Notes (Signed)
Affect flat. Denies SI/HI/AVH.  Minimal interaction.  Isolates to room.  Support and encouragement offered.  Safety maintained.

## 2017-09-23 NOTE — Tx Team (Signed)
Interdisciplinary Treatment and Diagnostic Plan Update  09/23/2017 Time of Session: 1112am Jannelly Bergren MRN: [id number]  Principal Diagnosis: Schizophrenia, paranoid (HCC)  Secondary Diagnoses: Principal Problem:   Schizophrenia, paranoid (HCC) Active Problems:   Cocaine use disorder, moderate, dependence (HCC)   Noncompliance   Tobacco use disorder   Current Medications:  Current Facility-Administered Medications  Medication Dose Route Frequency Provider Last Rate Last Dose  . acetaminophen (TYLENOL) tablet 650 mg  650 mg Oral Q6H PRN Clapacs, John T, MD      . alum & mag hydroxide-simeth (MAALOX/MYLANTA) 200-200-20 MG/5ML suspension 30 mL  30 mL Oral Q4H PRN Clapacs, John T, MD      . fluPHENAZine (PROLIXIN) tablet 10 mg  10 mg Oral BID AC & HS Pucilowska, Jolanta B, MD   10 mg at 09/23/17 0850  . [START ON 10/06/2017] fluPHENAZine decanoate (PROLIXIN) injection 50 mg  50 mg Intramuscular Q14 Days Pucilowska, Jolanta B, MD      . Influenza vac split quadrivalent PF (FLUARIX) injection 0.5 mL  0.5 mL Intramuscular Tomorrow-1000 Pucilowska, Jolanta B, MD      . lithium carbonate (ESKALITH) CR tablet 450 mg  450 mg Oral BID AC & HS Pucilowska, Jolanta B, MD   450 mg at 09/23/17 0850  . magnesium hydroxide (MILK OF MAGNESIA) suspension 30 mL  30 mL Oral Daily PRN Clapacs, John T, MD      . nicotine (NICODERM CQ - dosed in mg/24 hours) patch 21 mg  21 mg Transdermal Daily Pucilowska, Jolanta B, MD      . OLANZapine (ZYPREXA) tablet 30 mg  30 mg Oral QHS Pucilowska, Jolanta B, MD   30 mg at 09/22/17 2125  . pneumococcal 13-valent conjugate vaccine (PREVNAR 13) injection 0.5 mL  0.5 mL Intramuscular Tomorrow-1000 Pucilowska, Jolanta B, MD      . tuberculin injection 5 Units  5 Units Intradermal Once Pucilowska, Jolanta B, MD       PTA Medications: No prescriptions prior to admission.    Patient Stressors: Medication change or noncompliance  Patient Strengths: Ability for  insight  Treatment Modalities: Medication Management, Group therapy, Case management,  1 to 1 session with clinician, Psychoeducation, Recreational therapy.   Physician Treatment Plan for Primary Diagnosis: Schizophrenia, paranoid (HCC) Long Term Goal(s): Improvement in symptoms so as ready for discharge NA   Short Term Goals: Ability to identify changes in lifestyle to reduce recurrence of condition will improve Ability to verbalize feelings will improve Ability to disclose and discuss suicidal ideas Ability to demonstrate self-control will improve Ability to identify and develop effective coping behaviors will improve Ability to maintain clinical measurements within normal limits will improve Compliance with prescribed medications will improve Ability to identify triggers associated with substance abuse/mental health issues will improve NA  Medication Management: Evaluate patient's response, side effects, and tolerance of medication regimen.  Therapeutic Interventions: 1 to 1 sessions, Unit Group sessions and Medication administration.  Evaluation of Outcomes: Progressing  Physician Treatment Plan for Secondary Diagnosis: Principal Problem:   Schizophrenia, paranoid (HCC) Active Problems:   Cocaine use disorder, moderate, dependence (HCC)   Noncompliance   Tobacco use disorder  Long Term Goal(s): Improvement in symptoms so as ready for discharge NA   Short Term Goals: Ability to identify changes in lifestyle to reduce recurrence of condition will improve Ability to verbalize feelings will improve Ability to disclose and discuss suicidal ideas Ability to demonstrate self-control will improve Ability to identify and develop effective coping behaviors will improve  Ability to maintain clinical measurements within normal limits will improve Compliance with prescribed medications will improve Ability to identify triggers associated with substance abuse/mental health issues will  improve NA     Medication Management: Evaluate patient's response, side effects, and tolerance of medication regimen.  Therapeutic Interventions: 1 to 1 sessions, Unit Group sessions and Medication administration.  Evaluation of Outcomes: Progressing   RN Treatment Plan for Primary Diagnosis: Schizophrenia, paranoid (HCC) Long Term Goal(s): Knowledge of disease and therapeutic regimen to maintain health will improve  Short Term Goals: Ability to verbalize feelings will improve, Ability to identify and develop effective coping behaviors will improve and Compliance with prescribed medications will improve  Medication Management: RN will administer medications as ordered by provider, will assess and evaluate patient's response and provide education to patient for prescribed medication. RN will report any adverse and/or side effects to prescribing provider.  Therapeutic Interventions: 1 on 1 counseling sessions, Psychoeducation, Medication administration, Evaluate responses to treatment, Monitor vital signs and CBGs as ordered, Perform/monitor CIWA, COWS, AIMS and Fall Risk screenings as ordered, Perform wound care treatments as ordered.  Evaluation of Outcomes: Progressing   LCSW Treatment Plan for Primary Diagnosis: Schizophrenia, paranoid (HCC) Long Term Goal(s): Safe transition to appropriate next level of care at discharge, Engage patient in therapeutic group addressing interpersonal concerns.  Short Term Goals: Engage patient in aftercare planning with referrals and resources, Increase ability to appropriately verbalize feelings, Facilitate patient progression through stages of change regarding substance use diagnoses and concerns, Identify triggers associated with mental health/substance abuse issues and Increase skills for wellness and recovery  Therapeutic Interventions: Assess for all discharge needs, 1 to 1 time with Social worker, Explore available resources and support systems,  Assess for adequacy in community support network, Educate family and significant other(s) on suicide prevention, Complete Psychosocial Assessment, Interpersonal group therapy.  Evaluation of Outcomes: Progressing   Progress in Treatment: Attending groups: Yes. Participating in groups: Yes. Taking medication as prescribed: Yes. Toleration medication: Yes. Family/Significant other contact made: Yes, individual(s) contacted:  father, guardian Patient understands diagnosis: Yes. Discussing patient identified problems/goals with staff: Yes. Medical problems stabilized or resolved: Yes. Denies suicidal/homicidal ideation: Yes. Issues/concerns per patient self-inventory: Yes. Other:    New problem(s) identified: No, Describe:     New Short Term/Long Term Goal(s): to feel less depressed   Discharge Plan or Barriers: Pt will return to group home[facility] and follow up with [facility]  Reason for Continuation of Hospitalization: Delusions  Depression Hallucinations Medication stabilization  Estimated Length of Stay: 3-5 days  Attendees: Patient: 09/23/2017   Physician: Dr. Jennet Maduro, MD 09/23/2017   Nursing: Hulan Amato, RN 09/23/2017   RN Care Manager: 09/23/2017   Social Worker: Daleen Squibb, LCSW 09/23/2017   Recreational Therapist: Garret Reddish, CTRS, LRT 09/23/2017   Other:  09/23/2017   Other:  09/23/2017   Other: 09/23/2017           Scribe for Treatment Team: Lorri Frederick, LCSW 09/23/2017 2:22 PM

## 2017-09-24 NOTE — Progress Notes (Signed)
Denies SI/HI/AVH.   Discharge instructions reviewed with patient and guardian.  Both verbalized understanding.  Prescriptions given and personal belongings returned.  Care relinquished to the guardian.

## 2017-09-24 NOTE — Progress Notes (Signed)
  [facility] Adult Case Management Discharge Plan :  Will you be returning to the same living situation after discharge:  No.Pt will be staying with father/legal guardian. At discharge, do you have transportation home?: Yes,  father  Do you have the ability to pay for your medications: Yes,  [employer]  Release of information consent forms completed and in the chart;  Patient's signature needed at discharge.  Patient to Follow up at: Follow-up Information    [facility] [place]. Go on 09/27/2017.   Why:  Please attend your appointment with Dr Janeece Riggers on Tuesday, 09/27/17, at 9:00am.  Please bring a copy of your hospital discharge paperwork. Contact information: [address] [place] [address] [phone]           Next level of care provider has access to [facility] Link:no  Safety Planning and Suicide Prevention discussed: Yes,  with father/legal guardian     Has patient been referred to the Quitline?: Patient refused referral  Patient has been referred for addiction treatment: Yes  Lorri Frederick, LCSW 09/24/2017, 9:55 AM

## 2018-01-31 IMAGING — CR DG CHEST 1V
1 series · 1 of 1 positions shown · non-contrast
Comparison: None.

CLINICAL DATA: Tuberculosis.

EXAM:
CHEST 1 VIEW

[chest pa]
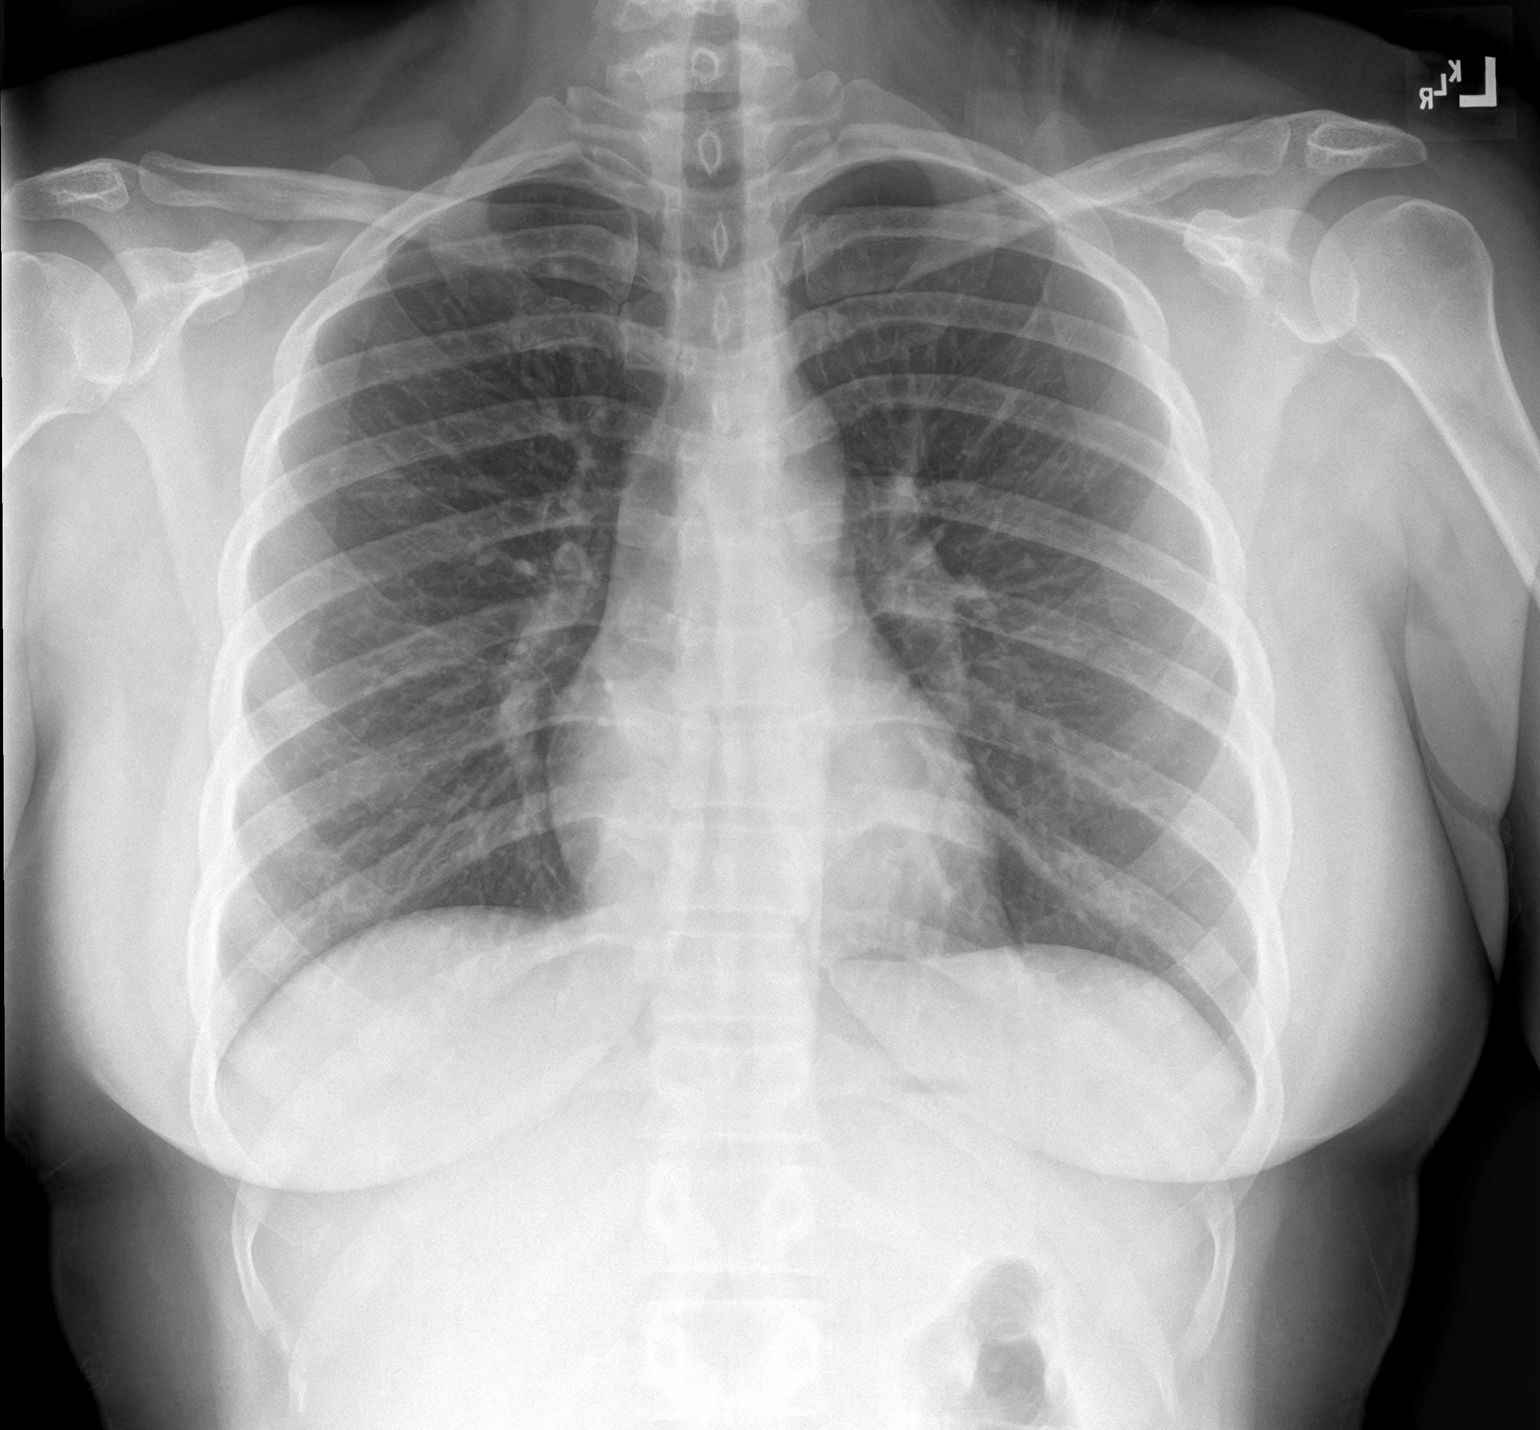

[1 of 1 positions shown; findings below may reference images not displayed]

FINDINGS: Midline trachea. Normal heart size and mediastinal contours. No
pleural effusion or pneumothorax. Clear lungs.
IMPRESSION: No acute cardiopulmonary disease.
